# Patient Record
Sex: Female | Born: 1935 | Race: Black or African American | Hispanic: No | Marital: Married | Smoking: Never smoker
Health system: Southern US, Community
[De-identification: ages and names within clinical notes are randomized; demographics above are authoritative.]

## PROBLEM LIST (undated history)

## (undated) DIAGNOSIS — N189 Chronic kidney disease, unspecified: Secondary | ICD-10-CM

## (undated) DIAGNOSIS — K219 Gastro-esophageal reflux disease without esophagitis: Secondary | ICD-10-CM

## (undated) DIAGNOSIS — E785 Hyperlipidemia, unspecified: Secondary | ICD-10-CM

## (undated) DIAGNOSIS — I10 Essential (primary) hypertension: Secondary | ICD-10-CM

## (undated) DIAGNOSIS — R011 Cardiac murmur, unspecified: Secondary | ICD-10-CM

## (undated) HISTORY — DX: Gastro-esophageal reflux disease without esophagitis: K21.9

## (undated) HISTORY — DX: Essential (primary) hypertension: I10

## (undated) HISTORY — DX: Chronic kidney disease, unspecified: N18.9

## (undated) HISTORY — DX: Hyperlipidemia, unspecified: E78.5

## (undated) HISTORY — PX: ABDOMINAL HYSTERECTOMY: SHX81

## (undated) HISTORY — PX: COLONOSCOPY: SHX174

---

## 2001-09-29 ENCOUNTER — Ambulatory Visit (HOSPITAL_COMMUNITY): Admission: RE | Admit: 2001-09-29 | Discharge: 2001-09-29 | Payer: Self-pay | Admitting: Specialist

## 2001-09-29 ENCOUNTER — Encounter: Payer: Self-pay | Admitting: Family Medicine

## 2002-05-11 ENCOUNTER — Ambulatory Visit (HOSPITAL_COMMUNITY): Admission: RE | Admit: 2002-05-11 | Discharge: 2002-05-11 | Payer: Self-pay | Admitting: General Surgery

## 2002-06-08 ENCOUNTER — Encounter: Payer: Self-pay | Admitting: Family Medicine

## 2002-06-08 ENCOUNTER — Ambulatory Visit (HOSPITAL_COMMUNITY): Admission: RE | Admit: 2002-06-08 | Discharge: 2002-06-08 | Payer: Self-pay | Admitting: Family Medicine

## 2002-08-10 ENCOUNTER — Ambulatory Visit (HOSPITAL_COMMUNITY): Admission: RE | Admit: 2002-08-10 | Discharge: 2002-08-10 | Payer: Self-pay | Admitting: Emergency Medicine

## 2002-08-10 ENCOUNTER — Encounter: Payer: Self-pay | Admitting: Family Medicine

## 2002-09-20 ENCOUNTER — Observation Stay (HOSPITAL_COMMUNITY): Admission: RE | Admit: 2002-09-20 | Discharge: 2002-09-21 | Payer: Self-pay | Admitting: General Surgery

## 2002-12-09 ENCOUNTER — Ambulatory Visit (HOSPITAL_COMMUNITY): Admission: RE | Admit: 2002-12-09 | Discharge: 2002-12-09 | Payer: Self-pay | Admitting: Family Medicine

## 2002-12-09 ENCOUNTER — Encounter: Payer: Self-pay | Admitting: Family Medicine

## 2005-09-16 ENCOUNTER — Encounter (INDEPENDENT_AMBULATORY_CARE_PROVIDER_SITE_OTHER): Payer: Self-pay | Admitting: Internal Medicine

## 2005-12-05 LAB — CONVERTED CEMR LAB
HDL: 60 mg/dL
Total CHOL/HDL Ratio: 2.6
VLDL: 18 mg/dL

## 2005-12-12 ENCOUNTER — Ambulatory Visit (HOSPITAL_COMMUNITY): Admission: RE | Admit: 2005-12-12 | Discharge: 2005-12-12 | Payer: Self-pay | Admitting: Family Medicine

## 2006-06-21 ENCOUNTER — Encounter (INDEPENDENT_AMBULATORY_CARE_PROVIDER_SITE_OTHER): Payer: Self-pay | Admitting: Internal Medicine

## 2006-07-04 ENCOUNTER — Encounter (INDEPENDENT_AMBULATORY_CARE_PROVIDER_SITE_OTHER): Payer: Self-pay | Admitting: Internal Medicine

## 2006-07-08 ENCOUNTER — Ambulatory Visit: Payer: Self-pay | Admitting: Internal Medicine

## 2006-07-08 LAB — CONVERTED CEMR LAB
AST: 28 units/L
Albumin: 4.3 g/dL
Alkaline Phosphatase: 108 units/L
BUN: 16 mg/dL
Potassium: 4.1 meq/L
Sodium: 144 meq/L
Total Bilirubin: 0.7 mg/dL

## 2006-07-10 ENCOUNTER — Encounter (INDEPENDENT_AMBULATORY_CARE_PROVIDER_SITE_OTHER): Payer: Self-pay | Admitting: Internal Medicine

## 2006-07-14 ENCOUNTER — Encounter (INDEPENDENT_AMBULATORY_CARE_PROVIDER_SITE_OTHER): Payer: Self-pay | Admitting: Internal Medicine

## 2006-07-17 ENCOUNTER — Ambulatory Visit (HOSPITAL_COMMUNITY): Admission: RE | Admit: 2006-07-17 | Discharge: 2006-07-17 | Payer: Self-pay | Admitting: Internal Medicine

## 2006-07-17 ENCOUNTER — Encounter (INDEPENDENT_AMBULATORY_CARE_PROVIDER_SITE_OTHER): Payer: Self-pay | Admitting: Internal Medicine

## 2006-08-19 ENCOUNTER — Encounter: Payer: Self-pay | Admitting: Internal Medicine

## 2006-08-19 DIAGNOSIS — E785 Hyperlipidemia, unspecified: Secondary | ICD-10-CM

## 2006-08-19 DIAGNOSIS — K589 Irritable bowel syndrome without diarrhea: Secondary | ICD-10-CM

## 2006-08-19 DIAGNOSIS — I10 Essential (primary) hypertension: Secondary | ICD-10-CM | POA: Insufficient documentation

## 2006-08-19 DIAGNOSIS — Z860100 Personal history of colon polyps, unspecified: Secondary | ICD-10-CM | POA: Insufficient documentation

## 2006-08-19 DIAGNOSIS — K219 Gastro-esophageal reflux disease without esophagitis: Secondary | ICD-10-CM | POA: Insufficient documentation

## 2006-12-22 ENCOUNTER — Ambulatory Visit: Payer: Self-pay | Admitting: Internal Medicine

## 2006-12-22 DIAGNOSIS — M810 Age-related osteoporosis without current pathological fracture: Secondary | ICD-10-CM | POA: Insufficient documentation

## 2006-12-24 LAB — CONVERTED CEMR LAB
ALT: 17 units/L (ref 0–35)
AST: 19 units/L (ref 0–37)
Alkaline Phosphatase: 106 units/L (ref 39–117)
CO2: 26 meq/L (ref 19–32)
Cholesterol: 150 mg/dL (ref 0–200)
Creatinine, Ser: 0.96 mg/dL (ref 0.40–1.20)
LDL Cholesterol: 78 mg/dL (ref 0–99)
Sodium: 142 meq/L (ref 135–145)
Total Bilirubin: 0.5 mg/dL (ref 0.3–1.2)
Total CHOL/HDL Ratio: 2.6
Total Protein: 7.2 g/dL (ref 6.0–8.3)
VLDL: 15 mg/dL (ref 0–40)

## 2006-12-29 ENCOUNTER — Ambulatory Visit (HOSPITAL_COMMUNITY): Admission: RE | Admit: 2006-12-29 | Discharge: 2006-12-29 | Payer: Self-pay

## 2007-01-04 ENCOUNTER — Encounter (INDEPENDENT_AMBULATORY_CARE_PROVIDER_SITE_OTHER): Payer: Self-pay | Admitting: Internal Medicine

## 2007-06-22 ENCOUNTER — Ambulatory Visit: Payer: Self-pay | Admitting: Internal Medicine

## 2007-06-23 ENCOUNTER — Telehealth (INDEPENDENT_AMBULATORY_CARE_PROVIDER_SITE_OTHER): Payer: Self-pay | Admitting: *Deleted

## 2007-06-23 LAB — CONVERTED CEMR LAB
ALT: 17 units/L (ref 0–35)
AST: 25 units/L (ref 0–37)
Albumin: 4.1 g/dL (ref 3.5–5.2)
Alkaline Phosphatase: 88 units/L (ref 39–117)
Calcium: 8.9 mg/dL (ref 8.4–10.5)
Chloride: 106 meq/L (ref 96–112)
Potassium: 3.4 meq/L — ABNORMAL LOW (ref 3.5–5.3)
Sodium: 142 meq/L (ref 135–145)
Total Protein: 6.6 g/dL (ref 6.0–8.3)

## 2008-03-30 ENCOUNTER — Ambulatory Visit: Payer: Self-pay | Admitting: Internal Medicine

## 2008-03-31 LAB — CONVERTED CEMR LAB
ALT: 16 units/L (ref 0–35)
AST: 26 units/L (ref 0–37)
Basophils Absolute: 0 10*3/uL (ref 0.0–0.1)
Basophils Relative: 1 % (ref 0–1)
CO2: 22 meq/L (ref 19–32)
Chloride: 106 meq/L (ref 96–112)
Cholesterol: 144 mg/dL (ref 0–200)
Creatinine, Ser: 0.91 mg/dL (ref 0.40–1.20)
Eosinophils Relative: 1 % (ref 0–5)
Lymphocytes Relative: 43 % (ref 12–46)
MCHC: 31.8 g/dL (ref 30.0–36.0)
Monocytes Absolute: 0.7 10*3/uL (ref 0.1–1.0)
Neutro Abs: 2.3 10*3/uL (ref 1.7–7.7)
Platelets: 195 10*3/uL (ref 150–400)
RDW: 13.4 % (ref 11.5–15.5)
Sodium: 142 meq/L (ref 135–145)
Total Bilirubin: 0.6 mg/dL (ref 0.3–1.2)
Total CHOL/HDL Ratio: 2.4
Total Protein: 6.9 g/dL (ref 6.0–8.3)
VLDL: 22 mg/dL (ref 0–40)

## 2008-04-14 ENCOUNTER — Ambulatory Visit (HOSPITAL_COMMUNITY): Admission: RE | Admit: 2008-04-14 | Discharge: 2008-04-14 | Payer: Self-pay | Admitting: Internal Medicine

## 2008-06-28 ENCOUNTER — Ambulatory Visit: Payer: Self-pay | Admitting: Internal Medicine

## 2008-09-04 ENCOUNTER — Telehealth (INDEPENDENT_AMBULATORY_CARE_PROVIDER_SITE_OTHER): Payer: Self-pay | Admitting: *Deleted

## 2008-09-26 ENCOUNTER — Ambulatory Visit: Payer: Self-pay | Admitting: Internal Medicine

## 2008-09-26 DIAGNOSIS — R011 Cardiac murmur, unspecified: Secondary | ICD-10-CM | POA: Insufficient documentation

## 2008-09-27 LAB — CONVERTED CEMR LAB
ALT: 15 units/L (ref 0–35)
AST: 20 units/L (ref 0–37)
Albumin: 4.2 g/dL (ref 3.5–5.2)
Alkaline Phosphatase: 88 units/L (ref 39–117)
Calcium: 9 mg/dL (ref 8.4–10.5)
Chloride: 106 meq/L (ref 96–112)
LDL Cholesterol: 63 mg/dL (ref 0–99)
Potassium: 3.5 meq/L (ref 3.5–5.3)
Sodium: 142 meq/L (ref 135–145)
Total Protein: 6.8 g/dL (ref 6.0–8.3)

## 2008-10-03 ENCOUNTER — Telehealth (INDEPENDENT_AMBULATORY_CARE_PROVIDER_SITE_OTHER): Payer: Self-pay | Admitting: *Deleted

## 2008-10-04 ENCOUNTER — Telehealth (INDEPENDENT_AMBULATORY_CARE_PROVIDER_SITE_OTHER): Payer: Self-pay | Admitting: *Deleted

## 2009-03-27 ENCOUNTER — Ambulatory Visit: Payer: Self-pay | Admitting: Internal Medicine

## 2009-03-28 ENCOUNTER — Encounter (INDEPENDENT_AMBULATORY_CARE_PROVIDER_SITE_OTHER): Payer: Self-pay | Admitting: Internal Medicine

## 2009-03-28 LAB — CONVERTED CEMR LAB
ALT: 14 units/L (ref 0–35)
Albumin: 4.1 g/dL (ref 3.5–5.2)
CO2: 25 meq/L (ref 19–32)
Calcium: 8.8 mg/dL (ref 8.4–10.5)
Chloride: 106 meq/L (ref 96–112)
Cholesterol: 136 mg/dL (ref 0–200)
Glucose, Bld: 78 mg/dL (ref 70–99)
Potassium: 3.8 meq/L (ref 3.5–5.3)
Sodium: 142 meq/L (ref 135–145)
Total Bilirubin: 0.6 mg/dL (ref 0.3–1.2)
Total Protein: 6.9 g/dL (ref 6.0–8.3)
VLDL: 12 mg/dL (ref 0–40)

## 2009-10-25 ENCOUNTER — Ambulatory Visit (HOSPITAL_COMMUNITY): Admission: RE | Admit: 2009-10-25 | Discharge: 2009-10-25 | Payer: Self-pay | Admitting: Internal Medicine

## 2010-05-22 ENCOUNTER — Encounter (HOSPITAL_COMMUNITY): Admission: RE | Admit: 2010-05-22 | Discharge: 2010-06-14 | Payer: Self-pay | Admitting: Internal Medicine

## 2010-05-23 ENCOUNTER — Ambulatory Visit (HOSPITAL_COMMUNITY): Payer: Self-pay | Admitting: Internal Medicine

## 2011-01-31 NOTE — Procedures (Signed)
NAME:  Megan Mcguire, Megan Mcguire NO.:  [id number]   MEDICAL RECORD NO.:  [id number]          PATIENT TYPE:  OUT   LOCATION:  [facility]                           FACILITY:  [facility]   PHYSICIAN:  Dani Gobble, MD       DATE OF BIRTH:  1930-10-13   DATE OF PROCEDURE:  DATE OF DISCHARGE:                                  ECHOCARDIOGRAM   INDICATIONS:  A 77 year old female with past medical history of hypertension  and osteoporosis, referred for evaluation of a cardiac murmur.   The technical quality of this study is adequate.   Aorta measures normally at 2.6 cm.   The left atrium measures normally at 0.4 cm.  No obvious clots or masses  were appreciated, and the patient appeared to be in sinus rhythm during the  procedure.   Interventricular septal and posterior wall are minimally thickened.   The aortic valve is trileaflet and also minimally thickened but without  limitation to leaflet excursion.  No aortic insufficiency is noted.   Doppler interrogation of the aortic valve was within normal limits.   The mitral valve appears mildly thickened, particularly on the anterior  leaflet without limitation to leaflet excursion.  No significant mitral  regurgitation is noted.  No mitral valve prolapse is noted.   Doppler interrogation of the mitral valve is within normal limits.  There  does appear to be a mildly lax chordae.   The pulmonic valve appears grossly structurally normal.   Tricuspid valve appears grossly structurally normal with mild tricuspid  regurgitation noted.   The left ventricle is normal in size with LV IDD measured at 4.0 cm and LV  IC measured at 3.0 cm.  Overall left systolic function is normal.  No  regional wall motion abnormalities noted.  Presence of diastolic dysfunction  is inferred from pulse wave Doppler across the mitral valve.   The right atrium is normal in size, but the right ventricle is mildly  dilated with normal right ventricular systolic  function.   IMPRESSION:  1. Borderline concentric left ventricular hypertrophy.  2. Minimal thickening to the aortic and mitral valve without limitation to      leaflet excursion of either.  3. Mild tricuspid regurgitation.  4. Normal left ventricular size and systolic function without regional      wall motion abnormality noted.  5. Mild right ventricular enlargement with preserved right systolic      function.  6. Presence of diastolic dysfunction is inferred from pulse wave Doppler      across the mitral valve.           ______________________________  Dani Gobble, MD     AB/MEDQ  D:  07/17/2006  T:  07/17/2006  Job:  [id number]   cc:   Dani Gobble, MD  Fax: [phone]   Erle Crocker, M.D.

## 2011-01-31 NOTE — H&P (Signed)
   NAME:  Megan Mcguire, Megan Mcguire                          ACCOUNT NO.:  [id number]   MEDICAL RECORD NO.:  [id number]                   PATIENT TYPE:  AMB   LOCATION:  DAY                                  FACILITY:  [facility]   PHYSICIAN:  Jerolyn Shin C. Katrinka Blazing, M.D.                DATE OF BIRTH:  10-21-1930   DATE OF ADMISSION:  DATE OF DISCHARGE:                                HISTORY & PHYSICAL   HISTORY OF PRESENT ILLNESS:  [age]-year-old female with history of  abdominal pain, past history of distention with a severe episode of pain  associated with nausea and vomiting.  Evaluation revealed cholelithiasis.  The patient is scheduled for cholecystectomy.   PAST HISTORY:  1. Hypertension.  2. Gastroesophageal reflux disease.  3. Hyperlipidemia.   MEDICATIONS:  1. Uniretic 7.5/12.5 q.d.  2. Toprol XL 50 mg q.d.  3. Zocor 20 mg q.h.s.  4. Aspirin 81 mg q.d.  5. Nexium 40 mg q.d.   ALLERGIES:  No known drug allergies.   PAST SURGICAL HISTORY:  Hysterectomy.   PHYSICAL EXAMINATION:  VITAL SIGNS:  Blood pressure 132/84, pulse 68 and  respirations 18.  Weight 131 pounds.  Height 5 feet 2 inches.  HEENT:  Unremarkable.  NECK:  Supple.  No JVD or bruits.  CHEST:  Clear to auscultation.  HEART:  Regular rate and rhythm without murmur, gallop or rub.  ABDOMEN:  Unremarkable, except for fullness in her left upper quadrant.  EXTREMITIES:  No cyanosis, clubbing or edema.  NEUROLOGIC EXAMINATION:  No focal motor, sensory or cerebellar deficit.   IMPRESSION:  1. Cholecystitis with cholelithiasis.  2. Hypertension.  3.     Hyperlipidemia.  4. Gastroesophageal reflux disease.   PLAN:  Laparoscopic cholecystectomy.                                                 Dirk Dress. Katrinka Blazing, M.D.    LCS/MEDQ  D:  09/19/2002  T:  09/20/2002  Job:  [id number]

## 2011-01-31 NOTE — Op Note (Signed)
   NAME:  Megan Mcguire, Megan Mcguire                          ACCOUNT NO.:  [id number]   MEDICAL RECORD NO.:  [id number]                   PATIENT TYPE:  AMB   LOCATION:  DAY                                  FACILITY:  [facility]   PHYSICIAN:  Jerolyn Shin C. Katrinka Blazing, M.D.                DATE OF BIRTH:  December 02, 1930   DATE OF PROCEDURE:  DATE OF DISCHARGE:                                 OPERATIVE REPORT   PREOPERATIVE DIAGNOSIS:  Cholelithiasis and cholecystitis.   POSTOPERATIVE DIAGNOSIS:  Cholelithiasis and cholecystitis.   OPERATION PERFORMED:  Laparoscopic cholecystectomy.   SURGEON:  Dirk Dress. Katrinka Blazing, M.D.   ANESTHESIA:  General.   DESCRIPTION OF PROCEDURE:  Under general endotracheal anesthesia, the  patient's abdomen was prepped and draped in sterile field.  A supraumbilical  incision was made.  A Veress needle was inserted without difficulty.  The  abdomen was insufflated with 2L of CO2.  The laparoscope was placed using a  Visiport guide.  A 10 mm port was placed uneventfully.  A laparoscope was  placed.  The gallbladder was visualized.  Using videoscopic guidance, a 10  mm and two 5 mm ports were placed uneventfully.  The gallbladder was grasped  and positioned.  The cystic artery was dissected, clipped with four clips  and divided.  The cystic duct was dissected, clipped with 5 clips and  divided.  There was a posterior cystic artery branch.  It was clipped with  three clips and divided.  Using electrocautery the gallbladder was separated  from the infrahepatic bed without difficulty.  Hemostasis was excellent.  The gallbladder was retrieved intact.  There was minimal blood loss.  Copious irrigation was carried out.  CO2 was allowed go escape from the  abdomen and the ports were removed.  The incisions were closed using 0 Dexon  on the fascia and staples on the skin.  Sterile dressings were placed.  She  was awakened from anesthesia uneventfully, transferred to her bed and taken  to the post  anesthesia care unit.                                                Dirk Dress. Katrinka Blazing, M.D.    LCS/MEDQ  D:  09/20/2002  T:  09/20/2002  Job:  [id number]

## 2011-01-31 NOTE — H&P (Signed)
   NAME:  Megan Mcguire, Megan Mcguire                          ACCOUNT NO.:  [id number]   MEDICAL RECORD NO.:  [id number]                   PATIENT TYPE:  AMB   LOCATION:  DAY                                  FACILITY:  [facility]   PHYSICIAN:  Jerolyn Shin C. Katrinka Blazing, M.D.                DATE OF BIRTH:  1931/03/02   DATE OF ADMISSION:  DATE OF DISCHARGE:                                HISTORY & PHYSICAL   HISTORY OF PRESENT ILLNESS:  [age]-year-old female with heme-/guaiac-  positive stool on fecal occult blood testing.  She has no history of blood  in her stool.  She has some gas pains.  There has been no constipation or  diarrhea.  Her weight has been stable.  There is no family history of colon  cancer.  The patient also has a history of indigestion, belching, food  intolerance, chest pain in the right upper quadrant, especially with greasy  and spicy foods.  The patient is scheduled for upper endoscopy and  colonoscopy.   PAST HISTORY:  She has hyperlipidemia, hypertension, gastroesophageal reflux  disease.   MEDICATIONS:  1. Maxzide 25 mg q.d.  2. Nexium 40 mg q.d.  3. Toprol-XL 50 mg q.d.  4. Zocor 20 mg q.h.s.   PHYSICAL EXAMINATION:  VITAL SIGNS:  Blood pressure 130/76, pulse 60,  respirations 18.  Weight 128 pounds.  HEENT:  Unremarkable.  NECK:  Neck supple without JVD or bruit.  CHEST:  Clear to auscultation.  HEART:  Regular rate and rhythm without murmur, gallop or rub.  ABDOMEN:  Fullness in the left upper quadrant and left mid-abdomen with  tenderness in the left mid-abdomen.  RECTAL:  Normal.  No masses felt.  Stool guaiac-positive.  EXTREMITIES:  No cyanosis, clubbing or edema.  NEUROLOGIC:  No focal motor, sensory or cerebellar deficit.   IMPRESSION:  1. Guaiac-positive stools.  2. Peptic ulcer disease with probable gastroesophageal reflux disease.  3. Abdominal mass.  4. Hypertension.   PLAN:  EGD and colonoscopy and CT of the abdomen and pelvis.                           Dirk Dress. Katrinka Blazing, M.D.   LCS/MEDQ  D:  05/10/2002  T:  05/11/2002  Job:  [id number]

## 2011-01-31 NOTE — Op Note (Signed)
   NAME:  Megan Mcguire, Megan Mcguire                          ACCOUNT NO.:  [id number]   MEDICAL RECORD NO.:  [id number]                   PATIENT TYPE:  AMB   LOCATION:  DAY                                  FACILITY:  [facility]   PHYSICIAN:  Jerolyn Shin C. Katrinka Blazing, M.D.                DATE OF BIRTH:  1930-11-02   DATE OF PROCEDURE:  09/20/2002  DATE OF DISCHARGE:                                 OPERATIVE REPORT   PREOPERATIVE DIAGNOSES:  Cholelithiasis, cholecystitis.   POSTOPERATIVE DIAGNOSES:  Cholelithiasis, cholecystitis.   PROCEDURE:  Laparoscopic cholecystectomy.   SURGEON:  Dirk Dress. Katrinka Blazing, M.D.   DESCRIPTION OF PROCEDURE:  Under general anesthesia, the patient's abdomen  was prepped and draped in a sterile field. A supraumbilical incision was  made. The Veress needle was inserted uneventfully. The abdomen was  insufflated with two liters of CO2. Using a Visiport guide, a 10 mm port was  placed uneventfully. The laparoscope was placed. Under videoscopic guidance,  a 10 mm port and two 5 mm ports were placed in the right upper quadrant. The  gallbladder was grasped and positioned. The cystic artery was dissected,  clipped with four clips and divided. The cystic duct was dissected, clipped  with five clips and divided. There was a posterior cystic artery branch  which was clipped with three clips and divided. Next, using electrocautery,  the gallbladder was oblated from the intrahepatic bed without difficulty.  Hemostasis in the bed was excellent. There was minimal blood loss. The  gallbladder was retrieved intact. Copious irrigation was carried out  uneventfully. The patient tolerated the procedure well. It should be noted  that I evaluated the left upper quadrant, the subphrenic area, the left  subhepatic area and the stomach. No mass effect was seen in this area. There  did not appear to be any mass of any significance in the retrogastric area.  CO2 was allowed to escape from the abdomen and the  ports were removed. The  incisions were closed.                                               Dirk Dress. Katrinka Blazing, M.D.    LCS/MEDQ  D:  09/20/2002  T:  09/20/2002  Job:  [id number]

## 2011-12-30 DIAGNOSIS — E785 Hyperlipidemia, unspecified: Secondary | ICD-10-CM | POA: Diagnosis not present

## 2011-12-30 DIAGNOSIS — I10 Essential (primary) hypertension: Secondary | ICD-10-CM | POA: Diagnosis not present

## 2012-07-02 DIAGNOSIS — R7301 Impaired fasting glucose: Secondary | ICD-10-CM | POA: Diagnosis not present

## 2012-07-02 DIAGNOSIS — K589 Irritable bowel syndrome without diarrhea: Secondary | ICD-10-CM | POA: Diagnosis not present

## 2012-07-02 DIAGNOSIS — E785 Hyperlipidemia, unspecified: Secondary | ICD-10-CM | POA: Diagnosis not present

## 2012-07-02 DIAGNOSIS — E782 Mixed hyperlipidemia: Secondary | ICD-10-CM | POA: Diagnosis not present

## 2012-07-02 DIAGNOSIS — I10 Essential (primary) hypertension: Secondary | ICD-10-CM | POA: Diagnosis not present

## 2012-07-02 DIAGNOSIS — Z23 Encounter for immunization: Secondary | ICD-10-CM | POA: Diagnosis not present

## 2012-12-29 DIAGNOSIS — E782 Mixed hyperlipidemia: Secondary | ICD-10-CM | POA: Diagnosis not present

## 2012-12-29 DIAGNOSIS — M810 Age-related osteoporosis without current pathological fracture: Secondary | ICD-10-CM | POA: Diagnosis not present

## 2012-12-29 DIAGNOSIS — R7301 Impaired fasting glucose: Secondary | ICD-10-CM | POA: Diagnosis not present

## 2012-12-29 DIAGNOSIS — I10 Essential (primary) hypertension: Secondary | ICD-10-CM | POA: Diagnosis not present

## 2012-12-29 DIAGNOSIS — R7309 Other abnormal glucose: Secondary | ICD-10-CM | POA: Diagnosis not present

## 2012-12-30 ENCOUNTER — Other Ambulatory Visit (HOSPITAL_COMMUNITY): Payer: Self-pay | Admitting: Internal Medicine

## 2012-12-30 DIAGNOSIS — Z9289 Personal history of other medical treatment: Secondary | ICD-10-CM

## 2013-01-04 ENCOUNTER — Other Ambulatory Visit (HOSPITAL_COMMUNITY): Payer: Self-pay

## 2013-01-07 ENCOUNTER — Ambulatory Visit (HOSPITAL_COMMUNITY)
Admission: RE | Admit: 2013-01-07 | Discharge: 2013-01-07 | Disposition: A | Discharge status: Home / Self Care | Payer: Medicare Other | Source: Ambulatory Visit | Attending: Internal Medicine | Admitting: Internal Medicine

## 2013-01-07 DIAGNOSIS — M810 Age-related osteoporosis without current pathological fracture: Secondary | ICD-10-CM | POA: Diagnosis not present

## 2013-01-07 DIAGNOSIS — Z9289 Personal history of other medical treatment: Secondary | ICD-10-CM

## 2013-01-31 DIAGNOSIS — L820 Inflamed seborrheic keratosis: Secondary | ICD-10-CM | POA: Diagnosis not present

## 2013-06-30 DIAGNOSIS — K589 Irritable bowel syndrome without diarrhea: Secondary | ICD-10-CM | POA: Diagnosis not present

## 2013-06-30 DIAGNOSIS — K219 Gastro-esophageal reflux disease without esophagitis: Secondary | ICD-10-CM | POA: Diagnosis not present

## 2013-06-30 DIAGNOSIS — E782 Mixed hyperlipidemia: Secondary | ICD-10-CM | POA: Diagnosis not present

## 2013-06-30 DIAGNOSIS — E785 Hyperlipidemia, unspecified: Secondary | ICD-10-CM | POA: Diagnosis not present

## 2013-06-30 DIAGNOSIS — I10 Essential (primary) hypertension: Secondary | ICD-10-CM | POA: Diagnosis not present

## 2013-06-30 DIAGNOSIS — K5900 Constipation, unspecified: Secondary | ICD-10-CM | POA: Diagnosis not present

## 2014-01-02 DIAGNOSIS — I10 Essential (primary) hypertension: Secondary | ICD-10-CM | POA: Diagnosis not present

## 2014-01-02 DIAGNOSIS — E782 Mixed hyperlipidemia: Secondary | ICD-10-CM | POA: Diagnosis not present

## 2014-01-02 DIAGNOSIS — R7309 Other abnormal glucose: Secondary | ICD-10-CM | POA: Diagnosis not present

## 2014-01-04 DIAGNOSIS — I10 Essential (primary) hypertension: Secondary | ICD-10-CM | POA: Diagnosis not present

## 2014-01-04 DIAGNOSIS — M810 Age-related osteoporosis without current pathological fracture: Secondary | ICD-10-CM | POA: Diagnosis not present

## 2014-01-04 DIAGNOSIS — E782 Mixed hyperlipidemia: Secondary | ICD-10-CM | POA: Diagnosis not present

## 2014-01-04 DIAGNOSIS — R7309 Other abnormal glucose: Secondary | ICD-10-CM | POA: Diagnosis not present

## 2014-06-02 DIAGNOSIS — I10 Essential (primary) hypertension: Secondary | ICD-10-CM | POA: Diagnosis not present

## 2014-06-02 DIAGNOSIS — E782 Mixed hyperlipidemia: Secondary | ICD-10-CM | POA: Diagnosis not present

## 2014-06-02 DIAGNOSIS — R7309 Other abnormal glucose: Secondary | ICD-10-CM | POA: Diagnosis not present

## 2014-06-06 DIAGNOSIS — Z23 Encounter for immunization: Secondary | ICD-10-CM | POA: Diagnosis not present

## 2014-06-06 DIAGNOSIS — E785 Hyperlipidemia, unspecified: Secondary | ICD-10-CM | POA: Diagnosis not present

## 2014-06-06 DIAGNOSIS — R7309 Other abnormal glucose: Secondary | ICD-10-CM | POA: Diagnosis not present

## 2014-06-06 DIAGNOSIS — I10 Essential (primary) hypertension: Secondary | ICD-10-CM | POA: Diagnosis not present

## 2014-06-06 DIAGNOSIS — R609 Edema, unspecified: Secondary | ICD-10-CM | POA: Diagnosis not present

## 2014-11-14 DIAGNOSIS — E782 Mixed hyperlipidemia: Secondary | ICD-10-CM | POA: Diagnosis not present

## 2014-11-14 DIAGNOSIS — R7301 Impaired fasting glucose: Secondary | ICD-10-CM | POA: Diagnosis not present

## 2014-11-16 DIAGNOSIS — I10 Essential (primary) hypertension: Secondary | ICD-10-CM | POA: Diagnosis not present

## 2014-11-16 DIAGNOSIS — K219 Gastro-esophageal reflux disease without esophagitis: Secondary | ICD-10-CM | POA: Diagnosis not present

## 2014-11-16 DIAGNOSIS — R809 Proteinuria, unspecified: Secondary | ICD-10-CM | POA: Diagnosis not present

## 2014-11-16 DIAGNOSIS — R7301 Impaired fasting glucose: Secondary | ICD-10-CM | POA: Diagnosis not present

## 2014-11-17 ENCOUNTER — Other Ambulatory Visit (HOSPITAL_COMMUNITY): Payer: Self-pay | Admitting: Internal Medicine

## 2014-11-17 DIAGNOSIS — M8580 Other specified disorders of bone density and structure, unspecified site: Secondary | ICD-10-CM

## 2014-11-17 DIAGNOSIS — Z1231 Encounter for screening mammogram for malignant neoplasm of breast: Secondary | ICD-10-CM

## 2014-11-29 ENCOUNTER — Ambulatory Visit (HOSPITAL_COMMUNITY)
Admission: RE | Admit: 2014-11-29 | Discharge: 2014-11-29 | Disposition: A | Discharge status: Home / Self Care | Payer: Medicare Other | Source: Ambulatory Visit | Attending: Internal Medicine | Admitting: Internal Medicine

## 2014-11-29 DIAGNOSIS — Z1231 Encounter for screening mammogram for malignant neoplasm of breast: Secondary | ICD-10-CM | POA: Insufficient documentation

## 2014-11-29 DIAGNOSIS — Z780 Asymptomatic menopausal state: Secondary | ICD-10-CM | POA: Diagnosis not present

## 2014-11-29 DIAGNOSIS — M810 Age-related osteoporosis without current pathological fracture: Secondary | ICD-10-CM | POA: Insufficient documentation

## 2014-11-29 DIAGNOSIS — M8580 Other specified disorders of bone density and structure, unspecified site: Secondary | ICD-10-CM

## 2014-12-15 DIAGNOSIS — I10 Essential (primary) hypertension: Secondary | ICD-10-CM | POA: Diagnosis not present

## 2014-12-15 DIAGNOSIS — Z6826 Body mass index (BMI) 26.0-26.9, adult: Secondary | ICD-10-CM | POA: Diagnosis not present

## 2015-04-03 DIAGNOSIS — E782 Mixed hyperlipidemia: Secondary | ICD-10-CM | POA: Diagnosis not present

## 2015-04-03 DIAGNOSIS — I10 Essential (primary) hypertension: Secondary | ICD-10-CM | POA: Diagnosis not present

## 2015-04-03 DIAGNOSIS — R7301 Impaired fasting glucose: Secondary | ICD-10-CM | POA: Diagnosis not present

## 2015-04-05 DIAGNOSIS — R809 Proteinuria, unspecified: Secondary | ICD-10-CM | POA: Diagnosis not present

## 2015-04-05 DIAGNOSIS — I10 Essential (primary) hypertension: Secondary | ICD-10-CM | POA: Diagnosis not present

## 2015-04-05 DIAGNOSIS — K219 Gastro-esophageal reflux disease without esophagitis: Secondary | ICD-10-CM | POA: Diagnosis not present

## 2015-04-05 DIAGNOSIS — M810 Age-related osteoporosis without current pathological fracture: Secondary | ICD-10-CM | POA: Diagnosis not present

## 2015-04-05 DIAGNOSIS — R7301 Impaired fasting glucose: Secondary | ICD-10-CM | POA: Diagnosis not present

## 2015-04-05 DIAGNOSIS — E785 Hyperlipidemia, unspecified: Secondary | ICD-10-CM | POA: Diagnosis not present

## 2015-08-01 DIAGNOSIS — E782 Mixed hyperlipidemia: Secondary | ICD-10-CM | POA: Diagnosis not present

## 2015-08-01 DIAGNOSIS — R7301 Impaired fasting glucose: Secondary | ICD-10-CM | POA: Diagnosis not present

## 2015-08-03 DIAGNOSIS — R7301 Impaired fasting glucose: Secondary | ICD-10-CM | POA: Diagnosis not present

## 2015-08-03 DIAGNOSIS — Z23 Encounter for immunization: Secondary | ICD-10-CM | POA: Diagnosis not present

## 2015-08-03 DIAGNOSIS — R944 Abnormal results of kidney function studies: Secondary | ICD-10-CM | POA: Diagnosis not present

## 2015-08-03 DIAGNOSIS — E782 Mixed hyperlipidemia: Secondary | ICD-10-CM | POA: Diagnosis not present

## 2015-08-03 DIAGNOSIS — I10 Essential (primary) hypertension: Secondary | ICD-10-CM | POA: Diagnosis not present

## 2015-11-01 DIAGNOSIS — R7301 Impaired fasting glucose: Secondary | ICD-10-CM | POA: Diagnosis not present

## 2015-11-01 DIAGNOSIS — E782 Mixed hyperlipidemia: Secondary | ICD-10-CM | POA: Diagnosis not present

## 2015-11-01 DIAGNOSIS — I10 Essential (primary) hypertension: Secondary | ICD-10-CM | POA: Diagnosis not present

## 2015-11-05 DIAGNOSIS — R7301 Impaired fasting glucose: Secondary | ICD-10-CM | POA: Diagnosis not present

## 2015-11-05 DIAGNOSIS — I10 Essential (primary) hypertension: Secondary | ICD-10-CM | POA: Diagnosis not present

## 2015-11-05 DIAGNOSIS — E782 Mixed hyperlipidemia: Secondary | ICD-10-CM | POA: Diagnosis not present

## 2015-11-05 DIAGNOSIS — N182 Chronic kidney disease, stage 2 (mild): Secondary | ICD-10-CM | POA: Diagnosis not present

## 2015-11-05 DIAGNOSIS — K219 Gastro-esophageal reflux disease without esophagitis: Secondary | ICD-10-CM | POA: Diagnosis not present

## 2015-11-05 DIAGNOSIS — Z23 Encounter for immunization: Secondary | ICD-10-CM | POA: Diagnosis not present

## 2015-12-12 DIAGNOSIS — I10 Essential (primary) hypertension: Secondary | ICD-10-CM | POA: Diagnosis not present

## 2015-12-12 DIAGNOSIS — K219 Gastro-esophageal reflux disease without esophagitis: Secondary | ICD-10-CM | POA: Diagnosis not present

## 2015-12-12 DIAGNOSIS — K580 Irritable bowel syndrome with diarrhea: Secondary | ICD-10-CM | POA: Diagnosis not present

## 2016-04-08 DIAGNOSIS — R7301 Impaired fasting glucose: Secondary | ICD-10-CM | POA: Diagnosis not present

## 2016-04-08 DIAGNOSIS — E782 Mixed hyperlipidemia: Secondary | ICD-10-CM | POA: Diagnosis not present

## 2016-04-10 DIAGNOSIS — R2240 Localized swelling, mass and lump, unspecified lower limb: Secondary | ICD-10-CM | POA: Diagnosis not present

## 2016-04-10 DIAGNOSIS — K219 Gastro-esophageal reflux disease without esophagitis: Secondary | ICD-10-CM | POA: Diagnosis not present

## 2016-04-10 DIAGNOSIS — I10 Essential (primary) hypertension: Secondary | ICD-10-CM | POA: Diagnosis not present

## 2016-04-10 DIAGNOSIS — K589 Irritable bowel syndrome without diarrhea: Secondary | ICD-10-CM | POA: Diagnosis not present

## 2016-04-10 DIAGNOSIS — E782 Mixed hyperlipidemia: Secondary | ICD-10-CM | POA: Diagnosis not present

## 2016-04-10 DIAGNOSIS — R7301 Impaired fasting glucose: Secondary | ICD-10-CM | POA: Diagnosis not present

## 2016-09-24 DIAGNOSIS — Z23 Encounter for immunization: Secondary | ICD-10-CM | POA: Diagnosis not present

## 2016-09-24 DIAGNOSIS — R7301 Impaired fasting glucose: Secondary | ICD-10-CM | POA: Diagnosis not present

## 2016-09-24 DIAGNOSIS — E782 Mixed hyperlipidemia: Secondary | ICD-10-CM | POA: Diagnosis not present

## 2016-09-26 DIAGNOSIS — B370 Candidal stomatitis: Secondary | ICD-10-CM | POA: Diagnosis not present

## 2016-09-26 DIAGNOSIS — E782 Mixed hyperlipidemia: Secondary | ICD-10-CM | POA: Diagnosis not present

## 2016-09-26 DIAGNOSIS — I10 Essential (primary) hypertension: Secondary | ICD-10-CM | POA: Diagnosis not present

## 2016-09-26 DIAGNOSIS — K219 Gastro-esophageal reflux disease without esophagitis: Secondary | ICD-10-CM | POA: Diagnosis not present

## 2016-09-26 DIAGNOSIS — R944 Abnormal results of kidney function studies: Secondary | ICD-10-CM | POA: Diagnosis not present

## 2016-09-26 DIAGNOSIS — K580 Irritable bowel syndrome with diarrhea: Secondary | ICD-10-CM | POA: Diagnosis not present

## 2016-09-26 DIAGNOSIS — R7301 Impaired fasting glucose: Secondary | ICD-10-CM | POA: Diagnosis not present

## 2016-09-26 DIAGNOSIS — Z0001 Encounter for general adult medical examination with abnormal findings: Secondary | ICD-10-CM | POA: Diagnosis not present

## 2017-03-31 DIAGNOSIS — R7301 Impaired fasting glucose: Secondary | ICD-10-CM | POA: Diagnosis not present

## 2017-03-31 DIAGNOSIS — I10 Essential (primary) hypertension: Secondary | ICD-10-CM | POA: Diagnosis not present

## 2017-04-02 DIAGNOSIS — K219 Gastro-esophageal reflux disease without esophagitis: Secondary | ICD-10-CM | POA: Diagnosis not present

## 2017-04-02 DIAGNOSIS — R7301 Impaired fasting glucose: Secondary | ICD-10-CM | POA: Diagnosis not present

## 2017-04-02 DIAGNOSIS — E782 Mixed hyperlipidemia: Secondary | ICD-10-CM | POA: Diagnosis not present

## 2017-04-02 DIAGNOSIS — I10 Essential (primary) hypertension: Secondary | ICD-10-CM | POA: Diagnosis not present

## 2017-04-02 DIAGNOSIS — N182 Chronic kidney disease, stage 2 (mild): Secondary | ICD-10-CM | POA: Diagnosis not present

## 2017-04-02 DIAGNOSIS — Z6825 Body mass index (BMI) 25.0-25.9, adult: Secondary | ICD-10-CM | POA: Diagnosis not present

## 2017-04-02 DIAGNOSIS — K580 Irritable bowel syndrome with diarrhea: Secondary | ICD-10-CM | POA: Diagnosis not present

## 2017-04-10 ENCOUNTER — Other Ambulatory Visit: Payer: Self-pay

## 2017-07-14 DIAGNOSIS — Z23 Encounter for immunization: Secondary | ICD-10-CM | POA: Diagnosis not present

## 2017-10-01 DIAGNOSIS — I10 Essential (primary) hypertension: Secondary | ICD-10-CM | POA: Diagnosis not present

## 2017-10-01 DIAGNOSIS — N182 Chronic kidney disease, stage 2 (mild): Secondary | ICD-10-CM | POA: Diagnosis not present

## 2017-10-01 DIAGNOSIS — E782 Mixed hyperlipidemia: Secondary | ICD-10-CM | POA: Diagnosis not present

## 2017-10-01 DIAGNOSIS — R7301 Impaired fasting glucose: Secondary | ICD-10-CM | POA: Diagnosis not present

## 2017-10-05 DIAGNOSIS — K219 Gastro-esophageal reflux disease without esophagitis: Secondary | ICD-10-CM | POA: Diagnosis not present

## 2017-10-05 DIAGNOSIS — E870 Hyperosmolality and hypernatremia: Secondary | ICD-10-CM | POA: Diagnosis not present

## 2017-10-05 DIAGNOSIS — R7303 Prediabetes: Secondary | ICD-10-CM | POA: Diagnosis not present

## 2017-10-05 DIAGNOSIS — I10 Essential (primary) hypertension: Secondary | ICD-10-CM | POA: Diagnosis not present

## 2017-10-05 DIAGNOSIS — Z6826 Body mass index (BMI) 26.0-26.9, adult: Secondary | ICD-10-CM | POA: Diagnosis not present

## 2017-10-05 DIAGNOSIS — N182 Chronic kidney disease, stage 2 (mild): Secondary | ICD-10-CM | POA: Diagnosis not present

## 2017-10-05 DIAGNOSIS — E782 Mixed hyperlipidemia: Secondary | ICD-10-CM | POA: Diagnosis not present

## 2017-10-05 DIAGNOSIS — K580 Irritable bowel syndrome with diarrhea: Secondary | ICD-10-CM | POA: Diagnosis not present

## 2018-03-28 DIAGNOSIS — E782 Mixed hyperlipidemia: Secondary | ICD-10-CM | POA: Diagnosis not present

## 2018-03-28 DIAGNOSIS — R7301 Impaired fasting glucose: Secondary | ICD-10-CM | POA: Diagnosis not present

## 2018-03-28 DIAGNOSIS — I10 Essential (primary) hypertension: Secondary | ICD-10-CM | POA: Diagnosis not present

## 2018-04-01 DIAGNOSIS — E870 Hyperosmolality and hypernatremia: Secondary | ICD-10-CM | POA: Diagnosis not present

## 2018-04-01 DIAGNOSIS — I10 Essential (primary) hypertension: Secondary | ICD-10-CM | POA: Diagnosis not present

## 2018-04-01 DIAGNOSIS — K219 Gastro-esophageal reflux disease without esophagitis: Secondary | ICD-10-CM | POA: Diagnosis not present

## 2018-04-01 DIAGNOSIS — R7303 Prediabetes: Secondary | ICD-10-CM | POA: Diagnosis not present

## 2018-04-01 DIAGNOSIS — Z6826 Body mass index (BMI) 26.0-26.9, adult: Secondary | ICD-10-CM | POA: Diagnosis not present

## 2018-04-01 DIAGNOSIS — K580 Irritable bowel syndrome with diarrhea: Secondary | ICD-10-CM | POA: Diagnosis not present

## 2018-04-01 DIAGNOSIS — E782 Mixed hyperlipidemia: Secondary | ICD-10-CM | POA: Diagnosis not present

## 2018-04-01 DIAGNOSIS — N182 Chronic kidney disease, stage 2 (mild): Secondary | ICD-10-CM | POA: Diagnosis not present

## 2018-04-01 DIAGNOSIS — Z0000 Encounter for general adult medical examination without abnormal findings: Secondary | ICD-10-CM | POA: Diagnosis not present

## 2018-08-20 DIAGNOSIS — Z23 Encounter for immunization: Secondary | ICD-10-CM | POA: Diagnosis not present

## 2018-10-05 DIAGNOSIS — R7303 Prediabetes: Secondary | ICD-10-CM | POA: Diagnosis not present

## 2018-10-05 DIAGNOSIS — I10 Essential (primary) hypertension: Secondary | ICD-10-CM | POA: Diagnosis not present

## 2018-10-05 DIAGNOSIS — R7301 Impaired fasting glucose: Secondary | ICD-10-CM | POA: Diagnosis not present

## 2018-10-05 DIAGNOSIS — E782 Mixed hyperlipidemia: Secondary | ICD-10-CM | POA: Diagnosis not present

## 2018-10-14 DIAGNOSIS — E870 Hyperosmolality and hypernatremia: Secondary | ICD-10-CM | POA: Diagnosis not present

## 2018-10-14 DIAGNOSIS — K580 Irritable bowel syndrome with diarrhea: Secondary | ICD-10-CM | POA: Diagnosis not present

## 2018-10-14 DIAGNOSIS — E782 Mixed hyperlipidemia: Secondary | ICD-10-CM | POA: Diagnosis not present

## 2018-10-14 DIAGNOSIS — K219 Gastro-esophageal reflux disease without esophagitis: Secondary | ICD-10-CM | POA: Diagnosis not present

## 2018-10-14 DIAGNOSIS — N182 Chronic kidney disease, stage 2 (mild): Secondary | ICD-10-CM | POA: Diagnosis not present

## 2018-10-14 DIAGNOSIS — R7303 Prediabetes: Secondary | ICD-10-CM | POA: Diagnosis not present

## 2018-10-14 DIAGNOSIS — I10 Essential (primary) hypertension: Secondary | ICD-10-CM | POA: Diagnosis not present

## 2018-10-14 DIAGNOSIS — K588 Other irritable bowel syndrome: Secondary | ICD-10-CM | POA: Diagnosis not present

## 2019-03-28 DIAGNOSIS — K219 Gastro-esophageal reflux disease without esophagitis: Secondary | ICD-10-CM | POA: Diagnosis not present

## 2019-03-28 DIAGNOSIS — B379 Candidiasis, unspecified: Secondary | ICD-10-CM | POA: Diagnosis not present

## 2019-04-14 ENCOUNTER — Other Ambulatory Visit: Payer: Self-pay

## 2019-04-18 DIAGNOSIS — E782 Mixed hyperlipidemia: Secondary | ICD-10-CM | POA: Diagnosis not present

## 2019-04-18 DIAGNOSIS — R7303 Prediabetes: Secondary | ICD-10-CM | POA: Diagnosis not present

## 2019-04-18 DIAGNOSIS — I10 Essential (primary) hypertension: Secondary | ICD-10-CM | POA: Diagnosis not present

## 2019-04-18 DIAGNOSIS — R7301 Impaired fasting glucose: Secondary | ICD-10-CM | POA: Diagnosis not present

## 2019-04-22 DIAGNOSIS — E870 Hyperosmolality and hypernatremia: Secondary | ICD-10-CM | POA: Diagnosis not present

## 2019-04-22 DIAGNOSIS — Z0000 Encounter for general adult medical examination without abnormal findings: Secondary | ICD-10-CM | POA: Diagnosis not present

## 2019-04-22 DIAGNOSIS — I10 Essential (primary) hypertension: Secondary | ICD-10-CM | POA: Diagnosis not present

## 2019-04-22 DIAGNOSIS — K580 Irritable bowel syndrome with diarrhea: Secondary | ICD-10-CM | POA: Diagnosis not present

## 2019-04-22 DIAGNOSIS — N182 Chronic kidney disease, stage 2 (mild): Secondary | ICD-10-CM | POA: Diagnosis not present

## 2019-04-22 DIAGNOSIS — K219 Gastro-esophageal reflux disease without esophagitis: Secondary | ICD-10-CM | POA: Diagnosis not present

## 2019-04-22 DIAGNOSIS — R7303 Prediabetes: Secondary | ICD-10-CM | POA: Diagnosis not present

## 2019-04-22 DIAGNOSIS — E782 Mixed hyperlipidemia: Secondary | ICD-10-CM | POA: Diagnosis not present

## 2019-10-17 DIAGNOSIS — Z23 Encounter for immunization: Secondary | ICD-10-CM | POA: Diagnosis not present

## 2019-11-01 DIAGNOSIS — R7303 Prediabetes: Secondary | ICD-10-CM | POA: Diagnosis not present

## 2019-11-01 DIAGNOSIS — E782 Mixed hyperlipidemia: Secondary | ICD-10-CM | POA: Diagnosis not present

## 2019-11-01 DIAGNOSIS — M816 Localized osteoporosis [Lequesne]: Secondary | ICD-10-CM | POA: Diagnosis not present

## 2019-11-01 DIAGNOSIS — K588 Other irritable bowel syndrome: Secondary | ICD-10-CM | POA: Diagnosis not present

## 2019-11-01 DIAGNOSIS — K580 Irritable bowel syndrome with diarrhea: Secondary | ICD-10-CM | POA: Diagnosis not present

## 2019-11-01 DIAGNOSIS — B379 Candidiasis, unspecified: Secondary | ICD-10-CM | POA: Diagnosis not present

## 2019-11-01 DIAGNOSIS — K219 Gastro-esophageal reflux disease without esophagitis: Secondary | ICD-10-CM | POA: Diagnosis not present

## 2019-11-01 DIAGNOSIS — Z6826 Body mass index (BMI) 26.0-26.9, adult: Secondary | ICD-10-CM | POA: Diagnosis not present

## 2019-11-01 DIAGNOSIS — I10 Essential (primary) hypertension: Secondary | ICD-10-CM | POA: Diagnosis not present

## 2019-11-01 DIAGNOSIS — R7301 Impaired fasting glucose: Secondary | ICD-10-CM | POA: Diagnosis not present

## 2019-11-01 DIAGNOSIS — R011 Cardiac murmur, unspecified: Secondary | ICD-10-CM | POA: Diagnosis not present

## 2019-11-01 DIAGNOSIS — N182 Chronic kidney disease, stage 2 (mild): Secondary | ICD-10-CM | POA: Diagnosis not present

## 2019-11-04 DIAGNOSIS — R7303 Prediabetes: Secondary | ICD-10-CM | POA: Diagnosis not present

## 2019-11-04 DIAGNOSIS — E870 Hyperosmolality and hypernatremia: Secondary | ICD-10-CM | POA: Diagnosis not present

## 2019-11-04 DIAGNOSIS — E782 Mixed hyperlipidemia: Secondary | ICD-10-CM | POA: Diagnosis not present

## 2019-11-04 DIAGNOSIS — I129 Hypertensive chronic kidney disease with stage 1 through stage 4 chronic kidney disease, or unspecified chronic kidney disease: Secondary | ICD-10-CM | POA: Diagnosis not present

## 2019-11-04 DIAGNOSIS — K219 Gastro-esophageal reflux disease without esophagitis: Secondary | ICD-10-CM | POA: Diagnosis not present

## 2019-11-04 DIAGNOSIS — K580 Irritable bowel syndrome with diarrhea: Secondary | ICD-10-CM | POA: Diagnosis not present

## 2019-11-14 DIAGNOSIS — Z23 Encounter for immunization: Secondary | ICD-10-CM | POA: Diagnosis not present

## 2019-11-29 DIAGNOSIS — H1045 Other chronic allergic conjunctivitis: Secondary | ICD-10-CM | POA: Diagnosis not present

## 2019-12-26 DIAGNOSIS — Z20828 Contact with and (suspected) exposure to other viral communicable diseases: Secondary | ICD-10-CM | POA: Diagnosis not present

## 2019-12-26 DIAGNOSIS — U071 COVID-19: Secondary | ICD-10-CM | POA: Diagnosis not present

## 2020-01-03 DIAGNOSIS — U071 COVID-19: Secondary | ICD-10-CM | POA: Diagnosis not present

## 2020-01-03 DIAGNOSIS — Z20828 Contact with and (suspected) exposure to other viral communicable diseases: Secondary | ICD-10-CM | POA: Diagnosis not present

## 2020-04-25 DIAGNOSIS — E870 Hyperosmolality and hypernatremia: Secondary | ICD-10-CM | POA: Diagnosis not present

## 2020-04-25 DIAGNOSIS — K580 Irritable bowel syndrome with diarrhea: Secondary | ICD-10-CM | POA: Diagnosis not present

## 2020-04-25 DIAGNOSIS — H1045 Other chronic allergic conjunctivitis: Secondary | ICD-10-CM | POA: Diagnosis not present

## 2020-04-25 DIAGNOSIS — B379 Candidiasis, unspecified: Secondary | ICD-10-CM | POA: Diagnosis not present

## 2020-04-25 DIAGNOSIS — N182 Chronic kidney disease, stage 2 (mild): Secondary | ICD-10-CM | POA: Diagnosis not present

## 2020-04-25 DIAGNOSIS — E782 Mixed hyperlipidemia: Secondary | ICD-10-CM | POA: Diagnosis not present

## 2020-04-25 DIAGNOSIS — K588 Other irritable bowel syndrome: Secondary | ICD-10-CM | POA: Diagnosis not present

## 2020-04-25 DIAGNOSIS — R7301 Impaired fasting glucose: Secondary | ICD-10-CM | POA: Diagnosis not present

## 2020-04-25 DIAGNOSIS — K219 Gastro-esophageal reflux disease without esophagitis: Secondary | ICD-10-CM | POA: Diagnosis not present

## 2020-04-25 DIAGNOSIS — R011 Cardiac murmur, unspecified: Secondary | ICD-10-CM | POA: Diagnosis not present

## 2020-04-25 DIAGNOSIS — M816 Localized osteoporosis [Lequesne]: Secondary | ICD-10-CM | POA: Diagnosis not present

## 2020-04-25 DIAGNOSIS — I10 Essential (primary) hypertension: Secondary | ICD-10-CM | POA: Diagnosis not present

## 2020-05-01 DIAGNOSIS — E870 Hyperosmolality and hypernatremia: Secondary | ICD-10-CM | POA: Diagnosis not present

## 2020-05-01 DIAGNOSIS — R7301 Impaired fasting glucose: Secondary | ICD-10-CM | POA: Diagnosis not present

## 2020-05-01 DIAGNOSIS — E782 Mixed hyperlipidemia: Secondary | ICD-10-CM | POA: Diagnosis not present

## 2020-05-01 DIAGNOSIS — K219 Gastro-esophageal reflux disease without esophagitis: Secondary | ICD-10-CM | POA: Diagnosis not present

## 2020-05-01 DIAGNOSIS — N1831 Chronic kidney disease, stage 3a: Secondary | ICD-10-CM | POA: Diagnosis not present

## 2020-05-01 DIAGNOSIS — K580 Irritable bowel syndrome with diarrhea: Secondary | ICD-10-CM | POA: Diagnosis not present

## 2020-05-01 DIAGNOSIS — B370 Candidal stomatitis: Secondary | ICD-10-CM | POA: Diagnosis not present

## 2020-05-01 DIAGNOSIS — Z0001 Encounter for general adult medical examination with abnormal findings: Secondary | ICD-10-CM | POA: Diagnosis not present

## 2020-05-01 DIAGNOSIS — U071 COVID-19: Secondary | ICD-10-CM | POA: Diagnosis not present

## 2020-05-01 DIAGNOSIS — Z20828 Contact with and (suspected) exposure to other viral communicable diseases: Secondary | ICD-10-CM | POA: Diagnosis not present

## 2020-05-01 DIAGNOSIS — I129 Hypertensive chronic kidney disease with stage 1 through stage 4 chronic kidney disease, or unspecified chronic kidney disease: Secondary | ICD-10-CM | POA: Diagnosis not present

## 2020-05-28 DIAGNOSIS — Z20828 Contact with and (suspected) exposure to other viral communicable diseases: Secondary | ICD-10-CM | POA: Diagnosis not present

## 2020-05-28 DIAGNOSIS — U071 COVID-19: Secondary | ICD-10-CM | POA: Diagnosis not present

## 2020-06-29 DIAGNOSIS — L235 Allergic contact dermatitis due to other chemical products: Secondary | ICD-10-CM | POA: Diagnosis not present

## 2020-07-13 DIAGNOSIS — I129 Hypertensive chronic kidney disease with stage 1 through stage 4 chronic kidney disease, or unspecified chronic kidney disease: Secondary | ICD-10-CM | POA: Diagnosis not present

## 2020-07-13 DIAGNOSIS — N182 Chronic kidney disease, stage 2 (mild): Secondary | ICD-10-CM | POA: Diagnosis not present

## 2020-07-13 DIAGNOSIS — E7849 Other hyperlipidemia: Secondary | ICD-10-CM | POA: Diagnosis not present

## 2020-07-13 DIAGNOSIS — K219 Gastro-esophageal reflux disease without esophagitis: Secondary | ICD-10-CM | POA: Diagnosis not present

## 2020-08-24 DIAGNOSIS — Z23 Encounter for immunization: Secondary | ICD-10-CM | POA: Diagnosis not present

## 2020-10-13 DIAGNOSIS — E870 Hyperosmolality and hypernatremia: Secondary | ICD-10-CM | POA: Diagnosis not present

## 2020-10-13 DIAGNOSIS — E782 Mixed hyperlipidemia: Secondary | ICD-10-CM | POA: Diagnosis not present

## 2020-10-13 DIAGNOSIS — Z23 Encounter for immunization: Secondary | ICD-10-CM | POA: Diagnosis not present

## 2020-10-13 DIAGNOSIS — I10 Essential (primary) hypertension: Secondary | ICD-10-CM | POA: Diagnosis not present

## 2020-10-25 DIAGNOSIS — N1831 Chronic kidney disease, stage 3a: Secondary | ICD-10-CM | POA: Diagnosis not present

## 2020-10-25 DIAGNOSIS — R011 Cardiac murmur, unspecified: Secondary | ICD-10-CM | POA: Diagnosis not present

## 2020-10-25 DIAGNOSIS — B379 Candidiasis, unspecified: Secondary | ICD-10-CM | POA: Diagnosis not present

## 2020-10-25 DIAGNOSIS — K580 Irritable bowel syndrome with diarrhea: Secondary | ICD-10-CM | POA: Diagnosis not present

## 2020-10-25 DIAGNOSIS — B370 Candidal stomatitis: Secondary | ICD-10-CM | POA: Diagnosis not present

## 2020-10-25 DIAGNOSIS — I10 Essential (primary) hypertension: Secondary | ICD-10-CM | POA: Diagnosis not present

## 2020-10-25 DIAGNOSIS — M816 Localized osteoporosis [Lequesne]: Secondary | ICD-10-CM | POA: Diagnosis not present

## 2020-10-25 DIAGNOSIS — E782 Mixed hyperlipidemia: Secondary | ICD-10-CM | POA: Diagnosis not present

## 2020-10-25 DIAGNOSIS — N182 Chronic kidney disease, stage 2 (mild): Secondary | ICD-10-CM | POA: Diagnosis not present

## 2020-10-25 DIAGNOSIS — H1045 Other chronic allergic conjunctivitis: Secondary | ICD-10-CM | POA: Diagnosis not present

## 2020-10-25 DIAGNOSIS — K219 Gastro-esophageal reflux disease without esophagitis: Secondary | ICD-10-CM | POA: Diagnosis not present

## 2020-10-25 DIAGNOSIS — R7301 Impaired fasting glucose: Secondary | ICD-10-CM | POA: Diagnosis not present

## 2020-10-30 DIAGNOSIS — I129 Hypertensive chronic kidney disease with stage 1 through stage 4 chronic kidney disease, or unspecified chronic kidney disease: Secondary | ICD-10-CM | POA: Diagnosis not present

## 2020-10-30 DIAGNOSIS — K580 Irritable bowel syndrome with diarrhea: Secondary | ICD-10-CM | POA: Diagnosis not present

## 2020-10-30 DIAGNOSIS — B370 Candidal stomatitis: Secondary | ICD-10-CM | POA: Diagnosis not present

## 2020-10-30 DIAGNOSIS — K219 Gastro-esophageal reflux disease without esophagitis: Secondary | ICD-10-CM | POA: Diagnosis not present

## 2020-10-30 DIAGNOSIS — E782 Mixed hyperlipidemia: Secondary | ICD-10-CM | POA: Diagnosis not present

## 2020-10-30 DIAGNOSIS — E870 Hyperosmolality and hypernatremia: Secondary | ICD-10-CM | POA: Diagnosis not present

## 2020-10-30 DIAGNOSIS — E559 Vitamin D deficiency, unspecified: Secondary | ICD-10-CM | POA: Diagnosis not present

## 2020-10-30 DIAGNOSIS — N1831 Chronic kidney disease, stage 3a: Secondary | ICD-10-CM | POA: Diagnosis not present

## 2020-10-30 DIAGNOSIS — R7301 Impaired fasting glucose: Secondary | ICD-10-CM | POA: Diagnosis not present

## 2020-12-12 DIAGNOSIS — E1165 Type 2 diabetes mellitus with hyperglycemia: Secondary | ICD-10-CM | POA: Diagnosis not present

## 2020-12-12 DIAGNOSIS — K922 Gastrointestinal hemorrhage, unspecified: Secondary | ICD-10-CM | POA: Diagnosis not present

## 2020-12-12 DIAGNOSIS — I10 Essential (primary) hypertension: Secondary | ICD-10-CM | POA: Diagnosis not present

## 2020-12-12 DIAGNOSIS — F419 Anxiety disorder, unspecified: Secondary | ICD-10-CM | POA: Diagnosis not present

## 2021-02-12 DIAGNOSIS — K219 Gastro-esophageal reflux disease without esophagitis: Secondary | ICD-10-CM | POA: Diagnosis not present

## 2021-02-12 DIAGNOSIS — E1165 Type 2 diabetes mellitus with hyperglycemia: Secondary | ICD-10-CM | POA: Diagnosis not present

## 2021-03-14 DIAGNOSIS — K219 Gastro-esophageal reflux disease without esophagitis: Secondary | ICD-10-CM | POA: Diagnosis not present

## 2021-03-14 DIAGNOSIS — E1165 Type 2 diabetes mellitus with hyperglycemia: Secondary | ICD-10-CM | POA: Diagnosis not present

## 2021-04-14 DIAGNOSIS — E1165 Type 2 diabetes mellitus with hyperglycemia: Secondary | ICD-10-CM | POA: Diagnosis not present

## 2021-04-14 DIAGNOSIS — K219 Gastro-esophageal reflux disease without esophagitis: Secondary | ICD-10-CM | POA: Diagnosis not present

## 2021-05-02 DIAGNOSIS — E119 Type 2 diabetes mellitus without complications: Secondary | ICD-10-CM | POA: Diagnosis not present

## 2021-05-02 DIAGNOSIS — I10 Essential (primary) hypertension: Secondary | ICD-10-CM | POA: Diagnosis not present

## 2021-05-06 DIAGNOSIS — E870 Hyperosmolality and hypernatremia: Secondary | ICD-10-CM | POA: Diagnosis not present

## 2021-05-06 DIAGNOSIS — Z0001 Encounter for general adult medical examination with abnormal findings: Secondary | ICD-10-CM | POA: Diagnosis not present

## 2021-05-06 DIAGNOSIS — R7301 Impaired fasting glucose: Secondary | ICD-10-CM | POA: Diagnosis not present

## 2021-05-06 DIAGNOSIS — I10 Essential (primary) hypertension: Secondary | ICD-10-CM | POA: Diagnosis not present

## 2021-05-06 DIAGNOSIS — I129 Hypertensive chronic kidney disease with stage 1 through stage 4 chronic kidney disease, or unspecified chronic kidney disease: Secondary | ICD-10-CM | POA: Diagnosis not present

## 2021-05-06 DIAGNOSIS — R809 Proteinuria, unspecified: Secondary | ICD-10-CM | POA: Diagnosis not present

## 2021-05-06 DIAGNOSIS — K219 Gastro-esophageal reflux disease without esophagitis: Secondary | ICD-10-CM | POA: Diagnosis not present

## 2021-05-06 DIAGNOSIS — E782 Mixed hyperlipidemia: Secondary | ICD-10-CM | POA: Diagnosis not present

## 2021-05-06 DIAGNOSIS — N1831 Chronic kidney disease, stage 3a: Secondary | ICD-10-CM | POA: Diagnosis not present

## 2021-05-06 DIAGNOSIS — K580 Irritable bowel syndrome with diarrhea: Secondary | ICD-10-CM | POA: Diagnosis not present

## 2021-05-06 DIAGNOSIS — E559 Vitamin D deficiency, unspecified: Secondary | ICD-10-CM | POA: Diagnosis not present

## 2021-10-15 DIAGNOSIS — I10 Essential (primary) hypertension: Secondary | ICD-10-CM | POA: Diagnosis not present

## 2021-10-15 DIAGNOSIS — E782 Mixed hyperlipidemia: Secondary | ICD-10-CM | POA: Diagnosis not present

## 2021-11-05 DIAGNOSIS — E782 Mixed hyperlipidemia: Secondary | ICD-10-CM | POA: Diagnosis not present

## 2021-11-05 DIAGNOSIS — R7301 Impaired fasting glucose: Secondary | ICD-10-CM | POA: Diagnosis not present

## 2021-11-05 DIAGNOSIS — E559 Vitamin D deficiency, unspecified: Secondary | ICD-10-CM | POA: Diagnosis not present

## 2021-11-07 DIAGNOSIS — R809 Proteinuria, unspecified: Secondary | ICD-10-CM | POA: Diagnosis not present

## 2021-11-07 DIAGNOSIS — E559 Vitamin D deficiency, unspecified: Secondary | ICD-10-CM | POA: Diagnosis not present

## 2021-11-07 DIAGNOSIS — K219 Gastro-esophageal reflux disease without esophagitis: Secondary | ICD-10-CM | POA: Diagnosis not present

## 2021-11-07 DIAGNOSIS — I129 Hypertensive chronic kidney disease with stage 1 through stage 4 chronic kidney disease, or unspecified chronic kidney disease: Secondary | ICD-10-CM | POA: Diagnosis not present

## 2021-11-07 DIAGNOSIS — I10 Essential (primary) hypertension: Secondary | ICD-10-CM | POA: Diagnosis not present

## 2021-11-07 DIAGNOSIS — K589 Irritable bowel syndrome without diarrhea: Secondary | ICD-10-CM | POA: Diagnosis not present

## 2021-11-07 DIAGNOSIS — E870 Hyperosmolality and hypernatremia: Secondary | ICD-10-CM | POA: Diagnosis not present

## 2021-11-07 DIAGNOSIS — R7301 Impaired fasting glucose: Secondary | ICD-10-CM | POA: Diagnosis not present

## 2021-11-07 DIAGNOSIS — N1831 Chronic kidney disease, stage 3a: Secondary | ICD-10-CM | POA: Diagnosis not present

## 2021-11-07 DIAGNOSIS — K580 Irritable bowel syndrome with diarrhea: Secondary | ICD-10-CM | POA: Diagnosis not present

## 2021-11-07 DIAGNOSIS — E782 Mixed hyperlipidemia: Secondary | ICD-10-CM | POA: Diagnosis not present

## 2022-01-14 DIAGNOSIS — H25813 Combined forms of age-related cataract, bilateral: Secondary | ICD-10-CM | POA: Diagnosis not present

## 2022-03-24 DIAGNOSIS — K219 Gastro-esophageal reflux disease without esophagitis: Secondary | ICD-10-CM | POA: Diagnosis not present

## 2022-03-24 DIAGNOSIS — B370 Candidal stomatitis: Secondary | ICD-10-CM | POA: Diagnosis not present

## 2022-04-01 ENCOUNTER — Encounter: Payer: Self-pay | Admitting: Internal Medicine

## 2022-04-14 DIAGNOSIS — I129 Hypertensive chronic kidney disease with stage 1 through stage 4 chronic kidney disease, or unspecified chronic kidney disease: Secondary | ICD-10-CM | POA: Diagnosis not present

## 2022-04-14 DIAGNOSIS — E782 Mixed hyperlipidemia: Secondary | ICD-10-CM | POA: Diagnosis not present

## 2022-04-14 DIAGNOSIS — N1831 Chronic kidney disease, stage 3a: Secondary | ICD-10-CM | POA: Diagnosis not present

## 2022-04-14 DIAGNOSIS — E1122 Type 2 diabetes mellitus with diabetic chronic kidney disease: Secondary | ICD-10-CM | POA: Diagnosis not present

## 2022-04-26 ENCOUNTER — Encounter: Payer: Self-pay | Admitting: Gastroenterology

## 2022-04-26 NOTE — Progress Notes (Unsigned)
   Referring Provider: Benita Stabile, MD Primary Care Physician:  Benita Stabile, MD Primary Gastroenterologist:  Dr. Jena Gauss  No chief complaint on file.   HPI:   Megan Mcguire is a 88 y.o. female presenting today at the request of Margo Aye, Kathleene Hazel, MD for GERD.      No past medical history on file.  *** The histories are not reviewed yet. Please review them in the "History" navigator section and refresh this SmartLink.  No current outpatient medications on file.   No current facility-administered medications for this visit.    Allergies as of 04/28/2022   (Not on File)    No family history on file.  Social History   Socioeconomic History   Marital status: Married    Spouse name: Not on file   Number of children: Not on file   Years of education: Not on file   Highest education level: Not on file  Occupational History   Not on file  Tobacco Use   Smoking status: Not on file   Smokeless tobacco: Not on file  Substance and Sexual Activity   Alcohol use: Not on file   Drug use: Not on file   Sexual activity: Not on file  Other Topics Concern   Not on file  Social History Narrative   Not on file   Social Determinants of Health   Financial Resource Strain: Not on file  Food Insecurity: Not on file  Transportation Needs: Not on file  Physical Activity: Not on file  Stress: Not on file  Social Connections: Not on file  Intimate Partner Violence: Not on file    Review of Systems: Gen: Denies any fever, chills, cold or flulike symptoms, presyncope, syncope. CV: Denies chest pain, heart palpitations. Resp: Denies shortness of breath, cough.  GI: See HPI GU : Denies urinary burning, urinary frequency, urinary hesitancy MS: Denies joint pain. Derm: Denies rash. Psych: Denies depression, anxiety. Heme: See HPI  Physical Exam: There were no vitals taken for this visit. General:   Alert and oriented. Pleasant and cooperative. Well-nourished and well-developed.   Head:  Normocephalic and atraumatic. Eyes:  Without icterus, sclera clear and conjunctiva pink.  Ears:  Normal auditory acuity. Lungs:  Clear to auscultation bilaterally. No wheezes, rales, or rhonchi. No distress.  Heart:  S1, S2 present without murmurs appreciated.  Abdomen:  +BS, soft, non-tender and non-distended. No HSM noted. No guarding or rebound. No masses appreciated.  Rectal:  Deferred  Msk:  Symmetrical without gross deformities. Normal posture. Extremities:  Without edema. Neurologic:  Alert and  oriented x4;  grossly normal neurologically. Skin:  Intact without significant lesions or rashes. Psych:  Normal mood and affect.    Assessment:     Plan:  ***   Ermalinda Memos, PA-C [facility] 04/28/2022

## 2022-04-28 ENCOUNTER — Encounter: Payer: Self-pay | Admitting: *Deleted

## 2022-04-28 ENCOUNTER — Ambulatory Visit (INDEPENDENT_AMBULATORY_CARE_PROVIDER_SITE_OTHER): Payer: Medicare Other | Admitting: Gastroenterology

## 2022-04-28 ENCOUNTER — Encounter: Payer: Self-pay | Admitting: Gastroenterology

## 2022-04-28 VITALS — BP 175/76 | HR 73 | Temp 98.4°F | Ht 61.0 in | Wt 128.6 lb

## 2022-04-28 DIAGNOSIS — R1013 Epigastric pain: Secondary | ICD-10-CM

## 2022-04-28 DIAGNOSIS — K219 Gastro-esophageal reflux disease without esophagitis: Secondary | ICD-10-CM | POA: Diagnosis not present

## 2022-04-28 DIAGNOSIS — K5900 Constipation, unspecified: Secondary | ICD-10-CM

## 2022-04-28 NOTE — Patient Instructions (Addendum)
We will arrange for you to have an upper endoscopy in the near future with Dr. Jena Gauss.  Continue taking pantoprazole 40 mg twice daily 30 minutes for breakfast and dinner for now.  For constipation: Start MiraLAX 1 capful (17 g) daily in 8 ounces of water. If you end up with frequent loose stools with taking MiraLAX daily, you can take every other day or try taking one half capful daily.  We will follow-up with you in the office after your upper endoscopy.  Do not hesitate to call if you have questions or concerns prior to your next visit.  It was very nice to meet you today!   Ermalinda Memos, PA-C [facility]

## 2022-04-29 DIAGNOSIS — E782 Mixed hyperlipidemia: Secondary | ICD-10-CM | POA: Diagnosis not present

## 2022-04-29 DIAGNOSIS — E559 Vitamin D deficiency, unspecified: Secondary | ICD-10-CM | POA: Diagnosis not present

## 2022-04-29 DIAGNOSIS — R7301 Impaired fasting glucose: Secondary | ICD-10-CM | POA: Diagnosis not present

## 2022-05-06 DIAGNOSIS — I10 Essential (primary) hypertension: Secondary | ICD-10-CM | POA: Diagnosis not present

## 2022-05-06 DIAGNOSIS — K580 Irritable bowel syndrome with diarrhea: Secondary | ICD-10-CM | POA: Diagnosis not present

## 2022-05-06 DIAGNOSIS — R7301 Impaired fasting glucose: Secondary | ICD-10-CM | POA: Diagnosis not present

## 2022-05-06 DIAGNOSIS — R809 Proteinuria, unspecified: Secondary | ICD-10-CM | POA: Diagnosis not present

## 2022-05-06 DIAGNOSIS — K5900 Constipation, unspecified: Secondary | ICD-10-CM | POA: Diagnosis not present

## 2022-05-06 DIAGNOSIS — I129 Hypertensive chronic kidney disease with stage 1 through stage 4 chronic kidney disease, or unspecified chronic kidney disease: Secondary | ICD-10-CM | POA: Diagnosis not present

## 2022-05-06 DIAGNOSIS — E870 Hyperosmolality and hypernatremia: Secondary | ICD-10-CM | POA: Diagnosis not present

## 2022-05-06 DIAGNOSIS — N1831 Chronic kidney disease, stage 3a: Secondary | ICD-10-CM | POA: Diagnosis not present

## 2022-05-06 DIAGNOSIS — E782 Mixed hyperlipidemia: Secondary | ICD-10-CM | POA: Diagnosis not present

## 2022-05-06 DIAGNOSIS — E559 Vitamin D deficiency, unspecified: Secondary | ICD-10-CM | POA: Diagnosis not present

## 2022-05-06 DIAGNOSIS — Z0001 Encounter for general adult medical examination with abnormal findings: Secondary | ICD-10-CM | POA: Diagnosis not present

## 2022-05-06 DIAGNOSIS — K219 Gastro-esophageal reflux disease without esophagitis: Secondary | ICD-10-CM | POA: Diagnosis not present

## 2022-05-22 NOTE — Progress Notes (Unsigned)
Cardiology Office Note:    Date:  05/22/2022   ID:  Megan Mcguire, DOB June 04, 1931, MRN [id number]  PCP:  Benita Stabile, MD   [facility] Providers Cardiologist:  None { Click to update primary MD,subspecialty MD or APP then REFRESH:1}    Referring MD: Benita Stabile, MD   No chief complaint on file. ***  History of Present Illness:    Megan Mcguire is a 88 y.o. female seen at the request of Dr Margo Aye for evaluation of cardiac murmur. She has a history of HLD, HTN, and CKD.   Past Medical History:  Diagnosis Date   CKD (chronic kidney disease)    GERD (gastroesophageal reflux disease)    HLD (hyperlipidemia)    HTN (hypertension)     Past Surgical History:  Procedure Laterality Date   COLONOSCOPY     years ago with polyps per patient    Current Medications: No outpatient medications have been marked as taking for the 05/26/22 encounter (Appointment) with [place], Tache Bobst M, MD.     Allergies:   Patient has no known allergies.   Social History   Socioeconomic History   Marital status: Married    Spouse name: Not on file   Number of children: Not on file   Years of education: Not on file   Highest education level: Not on file  Occupational History   Not on file  Tobacco Use   Smoking status: Never    Passive exposure: Never   Smokeless tobacco: Never  Substance and Sexual Activity   Alcohol use: Not Currently   Drug use: Never   Sexual activity: Not Currently  Other Topics Concern   Not on file  Social History Narrative   Not on file   Social Determinants of Health   Financial Resource Strain: Not on file  Food Insecurity: Not on file  Transportation Needs: Not on file  Physical Activity: Not on file  Stress: Not on file  Social Connections: Not on file     Family History: The patient's ***family history is not on file.  ROS:   Please see the history of present illness.    *** All other systems reviewed and are negative.  EKGs/Labs/Other  Studies Reviewed:    The following studies were reviewed today: ***  EKG:  EKG is *** ordered today.  The ekg ordered today demonstrates ***  Recent Labs: No results found for requested labs within last 365 days.  Recent Lipid Panel    Component Value Date/Time   CHOL 136 03/28/2009 0148   TRIG 59 03/28/2009 0148   HDL 62 03/28/2009 0148   CHOLHDL 2.2 Ratio 03/28/2009 0148   VLDL 12 03/28/2009 0148   LDLCALC 62 03/28/2009 0148   Dated 11/05/21: cholesterol 167, triglycerides 61, HDL 69, LDL 86. A1c 5.6%. creatinine 1.04. otherwise CMET and CBC normal.  Risk Assessment/Calculations:   {Does this patient have ATRIAL FIBRILLATION?:[phone]}  No BP recorded.  {Refresh Note OR Click here to enter BP  :1}***         Physical Exam:    VS:  There were no vitals taken for this visit.    Wt Readings from Last 3 Encounters:  04/28/22 128 lb 9.6 oz (58.3 kg)  07/04/06 129 lb (58.5 kg)     GEN: *** Well nourished, well developed in no acute distress HEENT: Normal NECK: No JVD; No carotid bruits LYMPHATICS: No lymphadenopathy CARDIAC: ***RRR, no murmurs, rubs, gallops RESPIRATORY:  Clear to  auscultation without rales, wheezing or rhonchi  ABDOMEN: Soft, non-tender, non-distended MUSCULOSKELETAL:  No edema; No deformity  SKIN: Warm and dry NEUROLOGIC:  Alert and oriented x 3 PSYCHIATRIC:  Normal affect   ASSESSMENT:    No diagnosis found. PLAN:    In order of problems listed above:  ***      {Are you ordering a CV Procedure (e.g. stress test, cath, DCCV, TEE, etc)?   Press F2        :[id number]}    Medication Adjustments/Labs and Tests Ordered: Current medicines are reviewed at length with the patient today.  Concerns regarding medicines are outlined above.  No orders of the defined types were placed in this encounter.  No orders of the defined types were placed in this encounter.   There are no Patient Instructions on file for this visit.   Signed, Sanaiya Welliver  [place], MD  05/22/2022 1:42 PM    [facility]

## 2022-05-26 ENCOUNTER — Encounter: Payer: Self-pay | Admitting: Cardiology

## 2022-05-26 ENCOUNTER — Ambulatory Visit: Payer: Medicare Other | Attending: Cardiology | Admitting: Cardiology

## 2022-05-26 VITALS — BP 130/84 | HR 60 | Ht 61.0 in | Wt 127.2 lb

## 2022-05-26 DIAGNOSIS — R011 Cardiac murmur, unspecified: Secondary | ICD-10-CM | POA: Insufficient documentation

## 2022-05-26 DIAGNOSIS — E7800 Pure hypercholesterolemia, unspecified: Secondary | ICD-10-CM | POA: Diagnosis not present

## 2022-05-26 DIAGNOSIS — I10 Essential (primary) hypertension: Secondary | ICD-10-CM | POA: Insufficient documentation

## 2022-05-26 NOTE — Patient Instructions (Signed)
Medication Instructions:  Continue same medications    Lab Work: None ordered   Testing/Procedures: Echo   Follow-Up: At [facility], you and your health needs are our priority.  As part of our continuing mission to provide you with exceptional heart care, we have created designated Provider Care Teams.  These Care Teams include your primary Cardiologist (physician) and Advanced Practice Providers (APPs -  Physician Assistants and Nurse Practitioners) who all work together to provide you with the care you need, when you need it.  We recommend signing up for the patient portal called "MyChart".  Sign up information is provided on this After Visit Summary.  MyChart is used to connect with patients for Virtual Visits (Telemedicine).  Patients are able to view lab/test results, encounter notes, upcoming appointments, etc.  Non-urgent messages can be sent to your provider as well.   To learn more about what you can do with MyChart, go to [email or web address].    Your next appointment:  1 year    Call in July to schedule Sept appointment     The format for your next appointment: Office    Provider:  Dr.Jordan   Important Information About Sugar

## 2022-05-27 DIAGNOSIS — E782 Mixed hyperlipidemia: Secondary | ICD-10-CM | POA: Diagnosis not present

## 2022-05-27 DIAGNOSIS — I10 Essential (primary) hypertension: Secondary | ICD-10-CM | POA: Diagnosis not present

## 2022-05-27 DIAGNOSIS — K589 Irritable bowel syndrome without diarrhea: Secondary | ICD-10-CM | POA: Diagnosis not present

## 2022-05-27 DIAGNOSIS — R011 Cardiac murmur, unspecified: Secondary | ICD-10-CM | POA: Diagnosis not present

## 2022-05-30 ENCOUNTER — Ambulatory Visit (HOSPITAL_COMMUNITY)
Admission: RE | Admit: 2022-05-30 | Discharge: 2022-05-30 | Disposition: A | Discharge status: Home / Self Care | Payer: Medicare Other | Source: Ambulatory Visit | Attending: Cardiology | Admitting: Cardiology

## 2022-05-30 DIAGNOSIS — I10 Essential (primary) hypertension: Secondary | ICD-10-CM | POA: Diagnosis not present

## 2022-05-30 DIAGNOSIS — R011 Cardiac murmur, unspecified: Secondary | ICD-10-CM

## 2022-05-30 DIAGNOSIS — E7800 Pure hypercholesterolemia, unspecified: Secondary | ICD-10-CM | POA: Diagnosis not present

## 2022-05-30 LAB — ECHOCARDIOGRAM COMPLETE
AR max vel: 2.36 cm2
AV Area VTI: 2.43 cm2
AV Area mean vel: 2.15 cm2
AV Mean grad: 3 mmHg
AV Peak grad: 6.9 mmHg
Ao pk vel: 1.31 m/s
Area-P 1/2: 2.87 cm2
MV VTI: 2.46 cm2
S' Lateral: 2.5 cm

## 2022-05-30 NOTE — Progress Notes (Signed)
*  PRELIMINARY RESULTS* Echocardiogram 2D Echocardiogram has been performed.  Carolyne Fiscal 05/30/2022, 1:23 PM

## 2022-06-02 NOTE — Patient Instructions (Signed)
Megan Mcguire  06/02/2022     @PREFPERIOPPHARMACY @   Your procedure is scheduled on  06/04/2022.   Report to [facility] at  0900  A.M.   Call this number if you have problems the morning of surgery:  [phone]   Remember:  Follow the diet instructions given to you by the office.     Take these medicines the morning of surgery with A SIP OF WATER                       amlodipine, librax, metoprolol, protonix.     Do not wear jewelry, make-up or nail polish.  Do not wear lotions, powders, or perfumes, or deodorant.  Do not shave 48 hours prior to surgery.  Men may shave face and neck.  Do not bring valuables to the hospital.  [facility] is not responsible for any belongings or valuables.  Contacts, dentures or bridgework may not be worn into surgery.  Leave your suitcase in the car.  After surgery it may be brought to your room.  For patients admitted to the hospital, discharge time will be determined by your treatment team.  Patients discharged the day of surgery will not be allowed to drive home and must have someone with them for 24 hours.    Special instructions:   DO NOT smoke tobacco or vape for 24 hours before your procedure.  Please read over the following fact sheets that you were given. Anesthesia Post-op Instructions and Care and Recovery After Surgery      Upper Endoscopy, Adult, Care After After the procedure, it is common to have a sore throat. It is also common to have: Mild stomach pain or discomfort. Bloating. Nausea. Follow these instructions at home: The instructions below may help you care for yourself at home. Your health care provider may give you more instructions. If you have questions, ask your health care provider. If you were given a sedative during the procedure, it can affect you for several hours. Do not drive or operate machinery until your health care provider says that it is safe. If you will be going home right after the  procedure, plan to have a responsible adult: Take you home from the hospital or clinic. You will not be allowed to drive. Care for you for the time you are told. Follow instructions from your health care provider about what you may eat and drink. Return to your normal activities as told by your health care provider. Ask your health care provider what activities are safe for you. Take over-the-counter and prescription medicines only as told by your health care provider. Contact a health care provider if you: Have a sore throat that lasts longer than one day. Have trouble swallowing. Have a fever. Get help right away if you: Vomit blood or your vomit looks like coffee grounds. Have bloody, black, or tarry stools. Have a very bad sore throat or you cannot swallow. Have difficulty breathing or very bad pain in your chest or abdomen. These symptoms may be an emergency. Get help right away. Call 911. Do not wait to see if the symptoms will go away. Do not drive yourself to the hospital. Summary After the procedure, it is common to have a sore throat, mild stomach discomfort, bloating, and nausea. If you were given a sedative during the procedure, it can affect you for several hours. Do not drive until your health care provider  says that it is safe. Follow instructions from your health care provider about what you may eat and drink. Return to your normal activities as told by your health care provider. This information is not intended to replace advice given to you by your health care provider. Make sure you discuss any questions you have with your health care provider. Document Revised: 12/11/2021 Document Reviewed: 12/11/2021 Elsevier Patient Education  [name] After This sheet gives you information about how to care for yourself after your procedure. Your health care provider may also give you more specific instructions. If you have problems or  questions, contact your health care provider. What can I expect after the procedure? After the procedure, it is common to have: Tiredness. Forgetfulness about what happened after the procedure. Impaired judgment for important decisions. Nausea or vomiting. Some difficulty with balance. Follow these instructions at home: For the time period you were told by your health care provider:     Rest as needed. Do not participate in activities where you could fall or become injured. Do not drive or use machinery. Do not drink alcohol. Do not take sleeping pills or medicines that cause drowsiness. Do not make important decisions or sign legal documents. Do not take care of children on your own. Eating and drinking Follow the diet that is recommended by your health care provider. Drink enough fluid to keep your urine pale yellow. If you vomit: Drink water, juice, or soup when you can drink without vomiting. Make sure you have little or no nausea before eating solid foods. General instructions Have a responsible adult stay with you for the time you are told. It is important to have someone help care for you until you are awake and alert. Take over-the-counter and prescription medicines only as told by your health care provider. If you have sleep apnea, surgery and certain medicines can increase your risk for breathing problems. Follow instructions from your health care provider about wearing your sleep device: Anytime you are sleeping, including during daytime naps. While taking prescription pain medicines, sleeping medicines, or medicines that make you drowsy. Avoid smoking. Keep all follow-up visits as told by your health care provider. This is important. Contact a health care provider if: You keep feeling nauseous or you keep vomiting. You feel light-headed. You are still sleepy or having trouble with balance after 24 hours. You develop a rash. You have a fever. You have redness or  swelling around the IV site. Get help right away if: You have trouble breathing. You have new-onset confusion at home. Summary For several hours after your procedure, you may feel tired. You may also be forgetful and have poor judgment. Have a responsible adult stay with you for the time you are told. It is important to have someone help care for you until you are awake and alert. Rest as told. Do not drive or operate machinery. Do not drink alcohol or take sleeping pills. Get help right away if you have trouble breathing, or if you suddenly become confused. This information is not intended to replace advice given to you by your health care provider. Make sure you discuss any questions you have with your health care provider. Document Revised: 08/06/2021 Document Reviewed: 08/04/2019 Elsevier Patient Education  [place].

## 2022-06-03 ENCOUNTER — Encounter (HOSPITAL_COMMUNITY): Payer: Self-pay

## 2022-06-03 ENCOUNTER — Encounter (HOSPITAL_COMMUNITY)
Admission: RE | Admit: 2022-06-03 | Discharge: 2022-06-03 | Disposition: A | Discharge status: Home / Self Care | Payer: Medicare Other | Source: Ambulatory Visit | Attending: Internal Medicine | Admitting: Internal Medicine

## 2022-06-03 VITALS — BP 157/70 | HR 63 | Temp 97.7°F | Resp 18 | Ht 61.0 in | Wt 127.2 lb

## 2022-06-03 DIAGNOSIS — N189 Chronic kidney disease, unspecified: Secondary | ICD-10-CM

## 2022-06-03 DIAGNOSIS — Z01812 Encounter for preprocedural laboratory examination: Secondary | ICD-10-CM | POA: Insufficient documentation

## 2022-06-03 LAB — BASIC METABOLIC PANEL
Anion gap: 10 (ref 5–15)
BUN: 19 mg/dL (ref 8–23)
CO2: 26 mmol/L (ref 22–32)
Calcium: 9.4 mg/dL (ref 8.9–10.3)
Chloride: 104 mmol/L (ref 98–111)
Creatinine, Ser: 1.29 mg/dL — ABNORMAL HIGH (ref 0.44–1.00)
GFR, Estimated: 40 mL/min — ABNORMAL LOW (ref >60)
Glucose, Bld: 157 mg/dL — ABNORMAL HIGH (ref 70–99)
Potassium: 3.4 mmol/L — ABNORMAL LOW (ref 3.5–5.1)
Sodium: 140 mmol/L (ref 135–145)

## 2022-06-04 ENCOUNTER — Ambulatory Visit (HOSPITAL_COMMUNITY): Payer: Medicare Other | Admitting: Anesthesiology

## 2022-06-04 ENCOUNTER — Encounter (HOSPITAL_COMMUNITY): Payer: Self-pay | Admitting: Internal Medicine

## 2022-06-04 ENCOUNTER — Other Ambulatory Visit: Payer: Self-pay

## 2022-06-04 ENCOUNTER — Ambulatory Visit (HOSPITAL_COMMUNITY)
Admission: RE | Admit: 2022-06-04 | Discharge: 2022-06-04 | Disposition: A | Discharge status: Home / Self Care | Payer: Medicare Other | Attending: Internal Medicine | Admitting: Internal Medicine

## 2022-06-04 ENCOUNTER — Encounter (HOSPITAL_COMMUNITY)
Admission: RE | Disposition: A | Discharge status: Home / Self Care | Payer: Self-pay | Source: Home / Self Care | Attending: Internal Medicine

## 2022-06-04 ENCOUNTER — Ambulatory Visit (HOSPITAL_BASED_OUTPATIENT_CLINIC_OR_DEPARTMENT_OTHER): Payer: Medicare Other | Admitting: Anesthesiology

## 2022-06-04 DIAGNOSIS — I129 Hypertensive chronic kidney disease with stage 1 through stage 4 chronic kidney disease, or unspecified chronic kidney disease: Secondary | ICD-10-CM | POA: Insufficient documentation

## 2022-06-04 DIAGNOSIS — K222 Esophageal obstruction: Secondary | ICD-10-CM | POA: Insufficient documentation

## 2022-06-04 DIAGNOSIS — K449 Diaphragmatic hernia without obstruction or gangrene: Secondary | ICD-10-CM

## 2022-06-04 DIAGNOSIS — Z79899 Other long term (current) drug therapy: Secondary | ICD-10-CM | POA: Insufficient documentation

## 2022-06-04 DIAGNOSIS — K219 Gastro-esophageal reflux disease without esophagitis: Secondary | ICD-10-CM | POA: Insufficient documentation

## 2022-06-04 DIAGNOSIS — N189 Chronic kidney disease, unspecified: Secondary | ICD-10-CM | POA: Diagnosis not present

## 2022-06-04 DIAGNOSIS — K2289 Other specified disease of esophagus: Secondary | ICD-10-CM | POA: Diagnosis not present

## 2022-06-04 DIAGNOSIS — R1013 Epigastric pain: Secondary | ICD-10-CM | POA: Insufficient documentation

## 2022-06-04 DIAGNOSIS — K3189 Other diseases of stomach and duodenum: Secondary | ICD-10-CM | POA: Diagnosis not present

## 2022-06-04 DIAGNOSIS — K319 Disease of stomach and duodenum, unspecified: Secondary | ICD-10-CM | POA: Diagnosis not present

## 2022-06-04 HISTORY — PX: BIOPSY: SHX5522

## 2022-06-04 HISTORY — DX: Cardiac murmur, unspecified: R01.1

## 2022-06-04 HISTORY — PX: ESOPHAGOGASTRODUODENOSCOPY (EGD) WITH PROPOFOL: SHX5813

## 2022-06-04 SURGERY — ESOPHAGOGASTRODUODENOSCOPY (EGD) WITH PROPOFOL
Anesthesia: General

## 2022-06-04 MED ORDER — PROPOFOL 10 MG/ML IV BOLUS
INTRAVENOUS | Status: DC | PRN
  Administered 2022-06-04: 80 mg via INTRAVENOUS
  Administered 2022-06-04: 40 mg via INTRAVENOUS

## 2022-06-04 MED ORDER — LIDOCAINE HCL 1 % IJ SOLN
INTRAMUSCULAR | Status: DC | PRN
  Administered 2022-06-04: 50 mg via INTRADERMAL

## 2022-06-04 MED ORDER — LACTATED RINGERS IV SOLN: INTRAVENOUS | Status: DC | PRN

## 2022-06-04 NOTE — Anesthesia Preprocedure Evaluation (Signed)
Anesthesia Evaluation  Patient identified by MRN, date of birth, ID band Patient awake    Reviewed: Allergy & Precautions, H&P , NPO status , Patient's Chart, lab work & pertinent test results, reviewed documented beta blocker date and time   Airway Mallampati: II  TM Distance: >3 FB Neck ROM: full    Dental no notable dental hx.    Pulmonary neg pulmonary ROS,    Pulmonary exam normal breath sounds clear to auscultation       Cardiovascular Exercise Tolerance: Good hypertension, negative cardio ROS   Rhythm:regular Rate:Normal     Neuro/Psych negative neurological ROS  negative psych ROS   GI/Hepatic Neg liver ROS, GERD  Medicated,  Endo/Other  negative endocrine ROS  Renal/GU CRFRenal disease  negative genitourinary   Musculoskeletal   Abdominal   Peds  Hematology negative hematology ROS (+)   Anesthesia Other Findings   Reproductive/Obstetrics negative OB ROS                             Anesthesia Physical Anesthesia Plan  ASA: 3  Anesthesia Plan: General   Post-op Pain Management:    Induction:   PONV Risk Score and Plan: Propofol infusion  Airway Management Planned:   Additional Equipment:   Intra-op Plan:   Post-operative Plan:   Informed Consent: I have reviewed the patients History and Physical, chart, labs and discussed the procedure including the risks, benefits and alternatives for the proposed anesthesia with the patient or authorized representative who has indicated his/her understanding and acceptance.     Dental Advisory Given  Plan Discussed with: CRNA  Anesthesia Plan Comments:         Anesthesia Quick Evaluation

## 2022-06-04 NOTE — Op Note (Signed)
[facility] Patient Name: Megan Mcguire Procedure Date: 06/04/2022 11:25 AM MRN: [id number] Date of Birth: 1930/09/20 Attending MD: Norvel Richards , MD CSN: [id number] Age: 88 Admit Type: Outpatient Procedure:                Upper GI endoscopy Indications:              Dyspepsia Providers:                Norvel Richards, MD, Lambert Mody, Starla Link RN, RN, Raphael Gibney, Technician Referring MD:              Medicines:                Propofol per Anesthesia Complications:            No immediate complications. Estimated Blood Loss:     Estimated blood loss was minimal. Procedure:                Pre-Anesthesia Assessment:                           - Prior to the procedure, a History and Physical                            was performed, and patient medications and                            allergies were reviewed. The patient's tolerance of                            previous anesthesia was also reviewed. The risks                            and benefits of the procedure and the sedation                            options and risks were discussed with the patient.                            All questions were answered, and informed consent                            was obtained. Prior Anticoagulants: The patient has                            taken no previous anticoagulant or antiplatelet                            agents. ASA Grade Assessment: III - A patient with                            severe systemic disease. After reviewing the risks  and benefits, the patient was deemed in                            satisfactory condition to undergo the procedure.                           After obtaining informed consent, the endoscope was                            passed under direct vision. Throughout the                            procedure, the patient's blood pressure, pulse, and                             oxygen saturations were monitored continuously. The                            [id number] scope was introduced through the                            mouth, and advanced to the second part of duodenum.                            The upper GI endoscopy was accomplished without                            difficulty. The patient tolerated the procedure                            well. Scope In: 11:42:27 AM Scope Out: 11:51:44 AM Total Procedure Duration: 0 hours 9 minutes 17 seconds  Findings:      7 mm nodule just above the GE junction. Schatzki's ring?"nonobstructing.      Stomach empty. Small hiatal hernia. Patchy scattered erythema. No ulcer       or infiltrating process. Patent pylorus.      The duodenal bulb and second portion of the duodenum were normal. Impression:               - Distal esophageal nodule uncertain                            significance?"status post biopsy. Noncritical                            Schatzki's ring?"not manipulated. Small hiatal                            hernia. Patchy gastric erythema?"status post biopsy;                            normal duodenal bulb and second portion of the                            duodenum aside from single prominent lymphangitic  appearing lesion in the second portion of the                            duodenum?"biopsied..                           - No specimens collected. Moderate Sedation:      Moderate (conscious) sedation was personally administered by an       anesthesia professional. The following parameters were monitored: oxygen       saturation, heart rate, blood pressure, respiratory rate, EKG, adequacy       of pulmonary ventilation, and response to care. Recommendation:           - Patient has a contact number available for                            emergencies. The signs and symptoms of potential                            delayed complications were discussed with the                             patient. Return to normal activities tomorrow.                            Written discharge instructions were provided to the                            patient.                           - Resume previous diet.                           - Continue present medications. Follow-up on                            pathology.                           - Return to my office in 6 weeks. Procedure Code(s):        --- Professional ---                           [phone], Esophagogastroduodenoscopy, flexible,                            transoral; diagnostic, including collection of                            specimen(s) by brushing or washing, when performed                            (separate procedure) Diagnosis Code(s):        --- Professional ---  R10.13, Epigastric pain CPT copyright 2019 American Medical Association. All rights reserved. The codes documented in this report are preliminary and upon coder review may  be revised to meet current compliance requirements. Cristopher Estimable. Laelah Siravo, MD Norvel Richards, MD 06/04/2022 11:59:28 AM This report has been signed electronically. Number of Addenda: 0

## 2022-06-04 NOTE — Transfer of Care (Addendum)
Immediate Anesthesia Transfer of Care Note  Patient: Megan Mcguire  Procedure(s) Performed: ESOPHAGOGASTRODUODENOSCOPY (EGD) WITH PROPOFOL BIOPSY  Patient Location: Short Stay  Anesthesia Type:General  Level of Consciousness: awake  Airway & Oxygen Therapy: Patient Spontanous Breathing  Post-op Assessment: Report given to RN  Post vital signs: Reviewed and stable  Last Vitals:  Vitals Value Taken Time  BP 98/67   Temp 37.2   Pulse 52   Resp 16   SpO2 96     Last Pain:  Vitals:   06/04/22 1139  TempSrc:   PainSc: 0-No pain      Patients Stated Pain Goal: 9 (16/38/45 3646)  Complications: No notable events documented.

## 2022-06-04 NOTE — Discharge Instructions (Signed)
EGD Discharge instructions Please read the instructions outlined below and refer to this sheet in the next few weeks. These discharge instructions provide you with general information on caring for yourself after you leave the hospital. Your doctor may also give you specific instructions. While your treatment has been planned according to the most current medical practices available, unavoidable complications occasionally occur. If you have any problems or questions after discharge, please call your doctor. ACTIVITY You may resume your regular activity but move at a slower pace for the next 24 hours.  Take frequent rest periods for the next 24 hours.  Walking will help expel (get rid of) the air and reduce the bloated feeling in your abdomen.  No driving for 24 hours (because of the anesthesia (medicine) used during the test).  You may shower.  Do not sign any important legal documents or operate any machinery for 24 hours (because of the anesthesia used during the test).  NUTRITION Drink plenty of fluids.  You may resume your normal diet.  Begin with a light meal and progress to your normal diet.  Avoid alcoholic beverages for 24 hours or as instructed by your caregiver.  MEDICATIONS You may resume your normal medications unless your caregiver tells you otherwise.  WHAT YOU CAN EXPECT TODAY You may experience abdominal discomfort such as a feeling of fullness or "gas" pains.  FOLLOW-UP Your doctor will discuss the results of your test with you.  SEEK IMMEDIATE MEDICAL ATTENTION IF ANY OF THE FOLLOWING OCCUR: Excessive nausea (feeling sick to your stomach) and/or vomiting.  Severe abdominal pain and distention (swelling).  Trouble swallowing.  Temperature over 101 F (37.8 C).  Rectal bleeding or vomiting of blood.     Biopsies of your esophagus stomach and duodenum were taken.  Your stomach appeared a little inflamed.  Further recommendations to follow next week once I get the  reports back for review.  Office visit with [place] in 6 weeks  At patient request, I called Vanita Panda at [phone]-reviewed findings and recommendations

## 2022-06-04 NOTE — H&P (Signed)
@LOGO @   Primary Care Physician:  , MD Primary Gastroenterologist:  Dr. Benita Stabile  Pre-Procedure History & Physical: HPI:  Megan Mcguire is a 88 y.o. female here for for further evaluation of epigastric pain and inadequately controlled GERD.  No dysphagia.  Bowel function much better with the addition of MiraLAX.  Past Medical History:  Diagnosis Date   CKD (chronic kidney disease)    GERD (gastroesophageal reflux disease)    Heart murmur    HLD (hyperlipidemia)    HTN (hypertension)     Past Surgical History:  Procedure Laterality Date   ABDOMINAL HYSTERECTOMY     COLONOSCOPY     years ago with polyps per patient    Prior to Admission medications   Medication Sig Start Date End Date Taking? Authorizing Provider  amLODipine (NORVASC) 10 MG tablet Take 10 mg by mouth daily. 12/25/21  Yes [provider]  aspirin EC 81 MG tablet Take 81 mg by mouth daily.   Yes [provider]  atorvastatin (LIPITOR) 10 MG tablet Take 10 mg by mouth daily. 03/10/22  Yes [provider]  cholecalciferol (VITAMIN D3) 25 MCG (1000 UNIT) tablet Take 1,000 Units by mouth daily.   Yes [provider]  clidinium-chlordiazePOXIDE (LIBRAX) 5-2.5 MG capsule Take 1 capsule by mouth in the morning and at bedtime. 03/24/22  Yes [provider]  metoprolol succinate (TOPROL-XL) 50 MG 24 hr tablet Take 50 mg by mouth daily. 02/13/22  Yes [provider]  Multiple Vitamin (MULTIVITAMIN WITH MINERALS) TABS tablet Take 1 tablet by mouth daily.   Yes [provider]  nystatin (MYCOSTATIN) 100000 UNIT/ML suspension Take 5 mLs by mouth 4 (four) times daily. 04/22/22  Yes [provider]  olmesartan (BENICAR) 40 MG tablet Take 40 mg by mouth daily.   Yes [provider]  polyethylene glycol (MIRALAX / GLYCOLAX) 17 g packet Take 17 g by mouth daily.   Yes [provider]  amLODipine-olmesartan (AZOR) 10-40 MG tablet Take 1  tablet by mouth daily. 05/27/22   [provider]  pantoprazole (PROTONIX) 40 MG tablet Take 40 mg by mouth 2 (two) times daily. 03/24/22   [provider]    Allergies as of 04/28/2022   (No Known Allergies)    Family History  Problem Relation Age of Onset   Hypertension Mother    Heart failure Father     Social History   Socioeconomic History   Marital status: Married    Spouse name: Not on file   Number of children: 6   Years of education: Not on file   Highest education level: Not on file  Occupational History   Not on file  Tobacco Use   Smoking status: Never    Passive exposure: Never   Smokeless tobacco: Never  Vaping Use   Vaping Use: Never used  Substance and Sexual Activity   Alcohol use: Not Currently   Drug use: Never   Sexual activity: Not Currently  Other Topics Concern   Not on file  Social History Narrative   Not on file   Social Determinants of Health   Financial Resource Strain: Not on file  Food Insecurity: Not on file  Transportation Needs: Not on file  Physical Activity: Not on file  Stress: Not on file  Social Connections: Not on file  Intimate Partner Violence: Not on file    Review of Systems: See HPI, otherwise negative ROS  Physical Exam: BP (!) 162/75  Pulse 73   Temp 98.2 F (36.8 C) (Oral)   Resp 17   SpO2 100%  General:   Alert,  Well-developed, well-nourished, pleasant and cooperative in NAD Skin:  Intact without significant lesions or rashes. Eyes:  Sclera clear, no icterus.   Conjunctiva pink. Ears:  Normal auditory acuity. Nose:  No deformity, discharge,  or lesions. Mouth:  No deformity or lesions. Neck:  Supple; no masses or thyromegaly. No significant cervical adenopathy. Lungs:  Clear throughout to auscultation.   No wheezes, crackles, or rhonchi. No acute distress. Heart:  Regular rate and rhythm; no murmurs, clicks, rubs,  or gallops. Abdomen: Non-distended, normal bowel sounds.  Soft and  nontender without appreciable mass or hepatosplenomegaly.  Pulses:  Normal pulses noted. Extremities:  Without clubbing or edema.  Impression/Plan: Epigastric bloating and burning with GERD inadequately controlled with PPI in an 88 year old lady.  Constipation now much better with MiraLAX. She denies dysphagia. Per plan, I have offered her a diagnostic EGD today.     The risks, benefits, limitations, alternatives and imponderables have been reviewed with the patient. Potential for esophageal dilation, biopsy, etc. have also been reviewed.  Questions have been answered. All parties agreeable.       Notice: This dictation was prepared with Dragon dictation along with smaller phrase technology. Any transcriptional errors that result from this process are unintentional and may not be corrected upon review.

## 2022-06-04 NOTE — Anesthesia Postprocedure Evaluation (Signed)
Anesthesia Post Note  Patient: Megan Mcguire  Procedure(s) Performed: ESOPHAGOGASTRODUODENOSCOPY (EGD) WITH PROPOFOL BIOPSY  Patient location during evaluation: Short Stay Anesthesia Type: General Level of consciousness: awake and alert Pain management: pain level controlled Vital Signs Assessment: post-procedure vital signs reviewed and stable Respiratory status: spontaneous breathing Cardiovascular status: blood pressure returned to baseline and stable Anesthetic complications: no   No notable events documented.   Last Vitals:  Vitals:   06/04/22 1159 06/04/22 1208  BP: 98/67 112/70  Pulse: (!) 52   Resp: 16   Temp: 37.2 C   SpO2: 96%     Last Pain:  Vitals:   06/04/22 1159  TempSrc: Oral  PainSc: 0-No pain                 Cosby Proby

## 2022-06-05 LAB — SURGICAL PATHOLOGY

## 2022-06-06 ENCOUNTER — Encounter: Payer: Self-pay | Admitting: Internal Medicine

## 2022-06-12 ENCOUNTER — Encounter (HOSPITAL_COMMUNITY): Payer: Self-pay | Admitting: Internal Medicine

## 2022-07-16 ENCOUNTER — Encounter: Payer: Self-pay | Admitting: Gastroenterology

## 2022-07-16 ENCOUNTER — Ambulatory Visit (INDEPENDENT_AMBULATORY_CARE_PROVIDER_SITE_OTHER): Payer: Medicare Other | Admitting: Gastroenterology

## 2022-07-16 VITALS — BP 164/79 | HR 71 | Temp 97.9°F | Ht 61.0 in | Wt 131.4 lb

## 2022-07-16 DIAGNOSIS — K219 Gastro-esophageal reflux disease without esophagitis: Secondary | ICD-10-CM | POA: Diagnosis not present

## 2022-07-16 DIAGNOSIS — R1012 Left upper quadrant pain: Secondary | ICD-10-CM

## 2022-07-16 DIAGNOSIS — K5900 Constipation, unspecified: Secondary | ICD-10-CM

## 2022-07-16 NOTE — Patient Instructions (Signed)
Reduce pantoprazole to 40mg  ONCE daily before breakfast. If you start having more burning in the upper abdomen or heartburn, you can go back to TWICE daily.  Please limit foods listed below as they can cause increased gas.  Purchase Gas-X over the counter. Take according to package instructions to see if helps with your gas pocket.  Continue Miralax for treatment of your constipation.  Return to see Dr. Gala Romney in 6 weeks.   Foods to avoid/limit due to increasing gas. Fruits Fresh, dried, and juiced forms of apple, pear, watermelon, peach, plum, cherries, apricots, blackberries, boysenberries, figs, nectarines, and mango. Avocado. Vegetables Chicory root, artichoke, asparagus, cabbage, snow peas, Brussels sprouts, broccoli, sugar snap peas, mushrooms, celery, and cauliflower. Onions, garlic, leeks, and the white part of scallions. Grains Wheat, including kamut, durum, and semolina. Barley and bulgur. Couscous. Wheat-based cereals. Wheat noodles, bread, crackers, and pastries. Meats and other proteins Fried or fatty meat. Sausage. Cashews and pistachios. Soybeans, baked beans, black beans, chickpeas, kidney beans, fava beans, navy beans, lentils, black-eyed peas, and split peas. Dairy Milk, yogurt, ice cream, and soft cheese. Cream and sour cream. Milk-based sauces. Custard. Buttermilk. Soy milk. Seasoning and other foods Any sugar-free gum or candy. Foods that contain artificial sweeteners such as sorbitol, mannitol, isomalt, or xylitol. Foods that contain honey, high-fructose corn syrup, or agave. Bouillon, vegetable stock, beef stock, and chicken stock. Garlic and onion powder. Condiments made with onion, such as hummus, chutney, pickles, relish, salad dressing, and salsa. Tomato paste. Beverages Chicory-based drinks. Coffee substitutes. Chamomile tea. Fennel tea. Sweet or fortified wines such as port or sherry. Diet soft drinks made with isomalt, mannitol, maltitol, sorbitol, or xylitol. Apple,  pear, and mango juice. Juices with high-fructose corn syrup.

## 2022-07-16 NOTE — Progress Notes (Signed)
GI Office Note    Referring Provider: Celene Squibb, MD Primary Care Physician:  Celene Squibb, MD  Primary Gastroenterologist: Garfield Cornea, MD   Chief Complaint   Chief Complaint  Patient presents with   Follow-up    Has gas "pockets" still    History of Present Illness   Megan Mcguire is a 88 y.o. female presenting today for follow-up.  She was seen back in August with complaints of chronic GERD.  Complained of epigastric burning radiated to the left upper quadrant for 1 year.  Typical heartburn.  Constipation managed with prune juice as needed, BMs every 3 to 4 days.  Overall doing better. Weight is up 4 pounds. No burning type pain now. LUQ gas pocket/quivering. Does not hurt. More of a nuisance. BM daily on Miralax may skip a day if stools regular. No n/v. Appetite fair. Feels better after eating. No fluid. No dysphagia. No melena, brbpr.   EGD September 2023: -Distal esophageal nodule uncertain significance status post biopsy.  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  -Small hiatal hernia.  -Patchy gastric erythema status post biopsy; mild nonspecific reactive gastropathy, no H. pylori -normal duodenal bulb and second portion of the duodenum aside from single prominent lymphangitic appearing lesion in the second portion of the duodenum, biopsy c/w duodenal lymphangiectasia    Medications   Current Outpatient Medications  Medication Sig Dispense Refill   amLODipine-olmesartan (AZOR) 10-40 MG tablet Take 1 tablet by mouth daily.     aspirin EC 81 MG tablet Take 81 mg by mouth daily.     atorvastatin (LIPITOR) 10 MG tablet Take 10 mg by mouth daily.     cholecalciferol (VITAMIN D3) 25 MCG (1000 UNIT) tablet Take 1,000 Units by mouth daily.     clidinium-chlordiazePOXIDE (LIBRAX) 5-2.5 MG capsule Take 1 capsule by mouth in the morning and at bedtime.     metoprolol succinate (TOPROL-XL)  50 MG 24 hr tablet Take 50 mg by mouth daily.     pantoprazole (PROTONIX) 40 MG tablet Take 40 mg by mouth 2 (two) times daily.     polyethylene glycol (MIRALAX / GLYCOLAX) 17 g packet Take 17 g by mouth daily.     No current facility-administered medications for this visit.    Allergies   Allergies as of 07/16/2022   (No Known Allergies)      Review of Systems   General: Negative for anorexia, weight loss, fever, chills, fatigue, weakness. ENT: Negative for hoarseness, difficulty swallowing , nasal congestion. CV: Negative for chest pain, angina, palpitations, dyspnea on exertion, peripheral edema.  Respiratory: Negative for dyspnea at rest, dyspnea on exertion, cough, sputum, wheezing.  GI: See history of present illness. GU:  Negative for dysuria, hematuria, urinary incontinence, urinary frequency, nocturnal urination.  Endo: Negative for unusual weight change.     Physical Exam   BP (!) 164/79 (BP Location: Right Arm, Patient Position: Sitting, Cuff Size: Normal)   Pulse 71   Temp 97.9 F (36.6 C) (Oral)   Ht 5\' 1"  (1.549 m)   Wt 131 lb 6.4 oz (59.6 kg)   SpO2 96%   BMI 24.83 kg/m    General: Well-nourished, well-developed in no acute distress.  Eyes: No icterus. Mouth: Oropharyngeal mucosa moist and pink , no lesions erythema or exudate. Abdomen: Bowel sounds are normal, nontender, nondistended, no hepatosplenomegaly or masses,  no abdominal bruits or hernia , no rebound or guarding.  Rectal: not performed Extremities: No lower extremity edema. No clubbing or deformities. Neuro: Alert and oriented x 4   Skin: Warm and dry, no jaundice.   Psych: Alert and cooperative, normal mood and affect.  Labs   None  Imaging Studies   No results found.  Assessment   GERD: doing well. We will have her reduce PPI to once daily if tolerated. Continue antireflux measures.  LUQ pain: pain resolved. She has a quivering feeling at times. She feels like it is gas and likely  correct. We will have her limit gas producing foods. She can try gas-x. Call with any recurrent pain.   Constipation: continue miralax daily as needed.    PLAN    Reduce pantoprazole to 40mg  daily. If recurrent burning in upper abdomen she can go back to BID. Limit high fodmap foods.  Trial of gas-x prn. Continue miralax one capful daily as needed. Return ov in six weeks with Dr. .  If any significant recurrent abdominal discomfort, could consider CT imaging.    Jena Gauss. Leanna Battles, MHS, PA-C [facility]

## 2022-07-20 ENCOUNTER — Encounter: Payer: Self-pay | Admitting: Gastroenterology

## 2022-08-15 ENCOUNTER — Ambulatory Visit (INDEPENDENT_AMBULATORY_CARE_PROVIDER_SITE_OTHER): Payer: Medicare Other | Admitting: Internal Medicine

## 2022-08-15 ENCOUNTER — Encounter: Payer: Self-pay | Admitting: Internal Medicine

## 2022-08-15 VITALS — BP 127/77 | HR 62 | Temp 97.9°F | Ht 61.0 in | Wt 129.2 lb

## 2022-08-15 DIAGNOSIS — K219 Gastro-esophageal reflux disease without esophagitis: Secondary | ICD-10-CM | POA: Diagnosis not present

## 2022-08-15 DIAGNOSIS — K9289 Other specified diseases of the digestive system: Secondary | ICD-10-CM

## 2022-08-15 NOTE — Patient Instructions (Signed)
It was nice to see you again today!  I believe Librax is a good treatment for you.  You can increase it to 3 or even 4 times a day (no more than 4 times daily) as needed for bowel symptoms  Continue Protonix 40 mg daily best taken 30 minutes before breakfast  May continue using Gas-X as needed  May also try IBgard similar over-the-counter agent containing peppermint oral to calm your bowels.  Office visit with [place] in 3 months

## 2022-08-15 NOTE — Progress Notes (Signed)
Primary Care Physician:  Benita Stabile, MD Primary Gastroenterologist:  Dr. Jena Gauss  Pre-Procedure History & Physical: HPI:  Megan Mcguire is a 88 y.o. female here for follow-up of dyspeptic symptoms and "gas pocket".  EGD recently demonstrated no ulcer or nonspecific gastropathy on biopsies no H. pylori.  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  Patient denies dysphagia; takes Protonix 40 mg once daily with good control of reflux symptoms. Planes of gas pockets in her abdomen that which move around.  Denies constipation.  She reminded me that she is taking Librax twice daily prescribed in [place] many years ago.  Has been getting refills to Dr. Scharlene Gloss office.  Has had any melena or rectal bleeding.  Reportedly had a colonoscopy in 2003 by Dr. Janeice Robinson.  I cannot find the report.  She denies frank abdominal pain.  She has been using Gas-X with improvement in gas symptoms.  She tells me she worries all the time and wonders if Megan Mcguire can contribute to any of her symptoms.  BP elevated today.  Past Medical History:  Diagnosis Date   CKD (chronic kidney disease)    GERD (gastroesophageal reflux disease)    Heart murmur    HLD (hyperlipidemia)    HTN (hypertension)     Past Surgical History:  Procedure Laterality Date   ABDOMINAL HYSTERECTOMY     BIOPSY  06/04/2022   Procedure: BIOPSY;  Surgeon: Corbin Ade, MD;  [facility];  Service: Endoscopy;;   COLONOSCOPY     years ago with polyps per patient   ESOPHAGOGASTRODUODENOSCOPY (EGD) WITH PROPOFOL N/A 06/04/2022   Procedure: ESOPHAGOGASTRODUODENOSCOPY (EGD) WITH PROPOFOL;  Surgeon: Corbin Ade, MD;  [facility];  Service: Endoscopy;  Laterality: N/A;  11:00 am    Prior to Admission medications   Medication Sig Start Date End Date Taking? Authorizing Provider  amLODipine-olmesartan (AZOR) 10-40 MG tablet Take 1 tablet by mouth daily. 05/27/22  Yes [provider]   aspirin EC 81 MG tablet Take 81 mg by mouth daily.   Yes [provider]  cholecalciferol (VITAMIN D3) 25 MCG (1000 UNIT) tablet Take 1,000 Units by mouth daily.   Yes [provider]  clidinium-chlordiazePOXIDE (LIBRAX) 5-2.5 MG capsule Take 1 capsule by mouth in the morning and at bedtime. 03/24/22  Yes [provider]  metoprolol succinate (TOPROL-XL) 50 MG 24 hr tablet Take 50 mg by mouth daily. 02/13/22  Yes [provider]  pantoprazole (PROTONIX) 40 MG tablet Take 40 mg by mouth 2 (two) times daily. 03/24/22  Yes [provider]  polyethylene glycol (MIRALAX / GLYCOLAX) 17 g packet Take 17 g by mouth daily.   Yes [provider]  Simethicone 125 MG CAPS Take 125 mg by mouth every 6 (six) hours as needed for flatulence.   Yes [provider]    Allergies as of 08/15/2022   (No Known Allergies)    Family History  Problem Relation Age of Onset   Hypertension Mother    Heart failure Father     Social History   Socioeconomic History   Marital status: Married    Spouse name: Not on file   Number of children: 6   Years of education: Not on file   Highest education level: Not on file  Occupational History   Not on file  Tobacco Use   Smoking status: Never    Passive exposure: Never   Smokeless tobacco: Never  Vaping  Use   Vaping Use: Never used  Substance and Sexual Activity   Alcohol use: Not Currently   Drug use: Never   Sexual activity: Not Currently  Other Topics Concern   Not on file  Social History Narrative   Not on file   Social Determinants of Health   Financial Resource Strain: Not on file  Food Insecurity: Not on file  Transportation Needs: Not on file  Physical Activity: Not on file  Stress: Not on file  Social Connections: Not on file  Intimate Partner Violence: Not on file    Review of Systems: See HPI, otherwise negative ROS  Physical Exam: BP (!) 151/81 (BP Location: Right Arm,  Patient Position: Sitting, Cuff Size: Normal)   Pulse 76   Temp 97.9 F (36.6 C) (Oral)   Ht 5\' 1"  (1.549 m)   Wt 129 lb 3.2 oz (58.6 kg)   SpO2 97%   BMI 24.41 kg/m  General:   Alert,  Well-developed, well-nourished, pleasant and cooperative in NAD Neck:  Supple; no masses or thyromegaly. No significant cervical adenopathy. Lungs:  Clear throughout to auscultation.   No wheezes, crackles, or rhonchi. No acute distress. Heart:  Regular rate and rhythm; no murmurs, clicks, rubs,  or gallops. Abdomen: Non-distended, normal bowel sounds.  Soft and nontender without appreciable mass or hepatosplenomegaly.  Pulses:  Normal pulses noted. Extremities:  Without clubbing or edema.  Impression/Plan: Pleasant 88 year old lady with dyspeptic symptoms fairly well-controlled on Protonix 40 mg once daily and Librax twice daily.  Gas-X as needed.  Really, no alarm symptoms.  Get a little more mileage about upping the dose of Librax which I think is an excellent treatment modality for this lady.  I see no reason for any further diagnostic/invasive studies at this time.  Recommendations  I believe Librax is a good treatment for you.  You can increase it to 3 or even 4 times a day (no more than 4 times daily) as needed for bowel symptoms  Continue Protonix 40 mg daily best taken 30 minutes before breakfast  May continue using Gas-X as needed  May also try IBgard similar over-the-counter agent containing peppermint oral to calm your bowels.  Office visit with [place] in 3 months  Recheck blood pressure today.  Notice: This dictation was prepared with Dragon dictation along with smaller phrase technology. Any transcriptional errors that result from this process are unintentional and may not be corrected upon review.

## 2022-08-26 ENCOUNTER — Ambulatory Visit: Payer: Medicare Other | Admitting: Internal Medicine

## 2022-10-22 DIAGNOSIS — R7301 Impaired fasting glucose: Secondary | ICD-10-CM | POA: Diagnosis not present

## 2022-10-22 DIAGNOSIS — E782 Mixed hyperlipidemia: Secondary | ICD-10-CM | POA: Diagnosis not present

## 2022-10-22 DIAGNOSIS — E559 Vitamin D deficiency, unspecified: Secondary | ICD-10-CM | POA: Diagnosis not present

## 2022-10-29 DIAGNOSIS — N1831 Chronic kidney disease, stage 3a: Secondary | ICD-10-CM | POA: Diagnosis not present

## 2022-10-29 DIAGNOSIS — I5030 Unspecified diastolic (congestive) heart failure: Secondary | ICD-10-CM | POA: Diagnosis not present

## 2022-10-29 DIAGNOSIS — K219 Gastro-esophageal reflux disease without esophagitis: Secondary | ICD-10-CM | POA: Diagnosis not present

## 2022-10-29 DIAGNOSIS — R809 Proteinuria, unspecified: Secondary | ICD-10-CM | POA: Diagnosis not present

## 2022-10-29 DIAGNOSIS — R011 Cardiac murmur, unspecified: Secondary | ICD-10-CM | POA: Diagnosis not present

## 2022-10-29 DIAGNOSIS — K5900 Constipation, unspecified: Secondary | ICD-10-CM | POA: Diagnosis not present

## 2022-10-29 DIAGNOSIS — I10 Essential (primary) hypertension: Secondary | ICD-10-CM | POA: Diagnosis not present

## 2022-10-29 DIAGNOSIS — E782 Mixed hyperlipidemia: Secondary | ICD-10-CM | POA: Diagnosis not present

## 2022-10-29 DIAGNOSIS — K580 Irritable bowel syndrome with diarrhea: Secondary | ICD-10-CM | POA: Diagnosis not present

## 2022-10-29 DIAGNOSIS — E870 Hyperosmolality and hypernatremia: Secondary | ICD-10-CM | POA: Diagnosis not present

## 2022-10-29 DIAGNOSIS — E559 Vitamin D deficiency, unspecified: Secondary | ICD-10-CM | POA: Diagnosis not present

## 2022-10-29 DIAGNOSIS — I130 Hypertensive heart and chronic kidney disease with heart failure and stage 1 through stage 4 chronic kidney disease, or unspecified chronic kidney disease: Secondary | ICD-10-CM | POA: Diagnosis not present

## 2022-11-13 NOTE — Progress Notes (Signed)
Referring Provider: Celene Squibb, MD Primary Care Physician:  Celene Squibb, MD Primary GI Physician: Dr. Gala Romney  Chief Complaint  Patient presents with   Follow-up    Stomach still burns sometimes     HPI:   Megan Mcguire is a 89 y.o. female with history of GERD, epigastric burning radiating to LUQ previously improved with PPI BID, constipation, "gas pockets" in stomach, presenting today for follow-up.   Last seen in our office 08/15/2022.  She reported gas pockets in her abdomen that move around.  She was taking Librax twice daily prescribed in [place] many years ago and had been getting refills at Dr. Juel Burrow office.  Had also been using Gas-X with improvement in symptoms.  Recommended taking Librax 3-4 times daily as needed, continuing Protonix daily, using Gas-X as needed, may try IBgard or similar over-the-counter agents containing peppermint oil, follow-up in 3 months.  Today:  Epigastric burning much improved.  Reflux well-controlled.  Feels bloated intermittently in the LUQ. Eventually passes gas or belches and symptom resolve. Getting a little better. Often notices it in the morning. Bowels moving fairly well, usually about every other day.  Takes MiraLAX every other day.  She does notice when her bowels move, her bloating is better.  She is not using Gas-X right now, but has in the past and it has helped.  Taking Librax twice daily.  States insurance would not pay for increased dosing that Dr. Gala Romney recommended.  No BRBPR, melena, vomiting, unintentional weight loss.  Reports she has a fungal infection in her mouth.  States this comes intermittently every few months.  She is been treated with nystatin in the past which works well.  She does have dentures and does not clean them daily.  EGD September 2023: -Distal esophageal nodule uncertain significance status post biopsy.  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  -Small hiatal hernia.  -Patchy gastric erythema status post biopsy; mild nonspecific reactive gastropathy, no H. pylori -normal duodenal bulb and second portion of the duodenum aside from single prominent lymphangitic appearing lesion in the second portion of the duodenum, biopsy c/w duodenal lymphangiectasia  Past Medical History:  Diagnosis Date   CKD (chronic kidney disease)    GERD (gastroesophageal reflux disease)    Heart murmur    HLD (hyperlipidemia)    HTN (hypertension)     Past Surgical History:  Procedure Laterality Date   ABDOMINAL HYSTERECTOMY     BIOPSY  06/04/2022   Procedure: BIOPSY;  Surgeon: Daneil Dolin, MD;  [facility];  Service: Endoscopy;;   COLONOSCOPY     years ago with polyps per patient   ESOPHAGOGASTRODUODENOSCOPY (EGD) WITH PROPOFOL N/A 06/04/2022   Procedure: ESOPHAGOGASTRODUODENOSCOPY (EGD) WITH PROPOFOL;  Surgeon: Daneil Dolin, MD;  [facility];  Service: Endoscopy;  Laterality: N/A;  11:00 am    Current Outpatient Medications  Medication Sig Dispense Refill   amLODipine-olmesartan (AZOR) 10-40 MG tablet Take 1 tablet by mouth daily.     aspirin EC 81 MG tablet Take 81 mg by mouth daily.     atorvastatin (LIPITOR) 10 MG tablet Take by mouth.     cholecalciferol (VITAMIN D3) 25 MCG (1000 UNIT) tablet Take 1,000 Units by mouth daily.     metoprolol succinate (TOPROL-XL) 50 MG 24 hr tablet Take 50 mg by mouth daily.     nystatin (MYCOSTATIN) 100000 UNIT/ML suspension Take 4 mLs (  400,000 Units total) by mouth 4 (four) times daily for 7 days. 60 mL 0   pantoprazole (PROTONIX) 40 MG tablet Take 40 mg by mouth 2 (two) times daily.     polyethylene glycol (MIRALAX / GLYCOLAX) 17 g packet Take 17 g by mouth daily.     Simethicone 125 MG CAPS Take 125 mg by mouth every 6 (six) hours as needed for flatulence.     clidinium-chlordiazePOXIDE (LIBRAX) 5-2.5 MG capsule Take 1 capsule by mouth in the morning and  at bedtime. 180 capsule 3   No current facility-administered medications for this visit.    Allergies as of 11/14/2022   (No Known Allergies)    Family History  Problem Relation Age of Onset   Hypertension Mother    Heart failure Father     Social History   Socioeconomic History   Marital status: Married    Spouse name: Not on file   Number of children: 6   Years of education: Not on file   Highest education level: Not on file  Occupational History   Not on file  Tobacco Use   Smoking status: Never    Passive exposure: Never   Smokeless tobacco: Never  Vaping Use   Vaping Use: Never used  Substance and Sexual Activity   Alcohol use: Not Currently   Drug use: Never   Sexual activity: Not Currently  Other Topics Concern   Not on file  Social History Narrative   Not on file   Social Determinants of Health   Financial Resource Strain: Not on file  Food Insecurity: Not on file  Transportation Needs: Not on file  Physical Activity: Not on file  Stress: Not on file  Social Connections: Not on file    Review of Systems: Gen: Denies fever, chills, cold or flulike symptoms, presyncope, syncope. CV: Denies chest pain, palpitations. Resp: Denies dyspnea, cough. GI: See HPI Heme: See HPI  Physical Exam: BP 138/72 (BP Location: Right Arm, Patient Position: Sitting, Cuff Size: Normal)   Pulse 65   Temp (!) 97.5 F (36.4 C) (Temporal)   Ht '5\' 1"'$  (1.549 m)   Wt 128 lb 12.8 oz (58.4 kg)   SpO2 98%   BMI 24.34 kg/m  General:   Alert and oriented. No distress noted. Pleasant and cooperative.  Head:  Normocephalic and atraumatic. Eyes:  Conjuctiva clear without scleral icterus. Mouth: Scattered white plaques noted on tongue consistent with oral candidiasis. Heart:  S1, S2 present without murmurs appreciated. Lungs:  Clear to auscultation bilaterally. No wheezes, rales, or rhonchi. No distress.  Abdomen:  +BS, soft, non-tender and non-distended. No rebound or  guarding. No HSM or masses noted. Msk:  Symmetrical without gross deformities. Normal posture. Extremities:  Without edema. Neurologic:  Alert and  oriented x4 Psych:  Normal mood and affect.    Assessment:  89 y.o. female with history of GERD, epigastric burning radiating to LUQ, constipation, "gas pockets" in the stomach, presenting today for routine follow-up.  Constipation/bloating: Fairly well-controlled with MiraLAX every other day.  Also taking Librax twice daily.  Continues with intermittent bloating, primarily in the morning, improved by passing gas or belching.  She does notice improvement on the days that she has a bowel movement which is usually every other day.  Recommended MiraLAX daily with a goal of having bowel movements every day.  Also advised to limit gas producing foods and try taking Gas-X at night to help with morning bloating.  GERD: Well-controlled on pantoprazole  40 mg twice daily.  Oral candidiasis: 4 mL nystatin swish and spit 4 times daily x 7 days. Recommended cleaning dentures daily and follow-up with PCP.     Plan:   MiraLAX 17 g in 8 ounces of water daily. May decrease to 1/2 capful daily if she develops diarrhea.  Simethicone prn. Recommended taking at bedtime daily to help with morning bloating.  Limit common gas producing items including broccoli, cabbage, cauliflower, Brussels sprouts, beans, artificial sweeteners, carbonated beverages, chewing gum, hard candy, drinking through a straw. Continue Librax BID.  Continue pantoprazole 40 mg BID.  4 ml Nystatin suspension  QID x 7 days.  Clean dentures daily. Follow-up with PCP on oral candidiasis.  Follow-up in our office in 3 months.    Aliene Altes, PA-C [facility] 11/14/2022

## 2022-11-14 ENCOUNTER — Encounter: Payer: Self-pay | Admitting: Gastroenterology

## 2022-11-14 ENCOUNTER — Ambulatory Visit (INDEPENDENT_AMBULATORY_CARE_PROVIDER_SITE_OTHER): Payer: Medicare Other | Admitting: Gastroenterology

## 2022-11-14 VITALS — BP 138/72 | HR 65 | Temp 97.5°F | Ht 61.0 in | Wt 128.8 lb

## 2022-11-14 DIAGNOSIS — K5900 Constipation, unspecified: Secondary | ICD-10-CM | POA: Diagnosis not present

## 2022-11-14 DIAGNOSIS — K9289 Other specified diseases of the digestive system: Secondary | ICD-10-CM | POA: Insufficient documentation

## 2022-11-14 DIAGNOSIS — K219 Gastro-esophageal reflux disease without esophagitis: Secondary | ICD-10-CM | POA: Diagnosis not present

## 2022-11-14 DIAGNOSIS — B370 Candidal stomatitis: Secondary | ICD-10-CM

## 2022-11-14 MED ORDER — NYSTATIN 100000 UNIT/ML MT SUSP: 4.0000 mL | Freq: Four times a day (QID) | OROMUCOSAL | 0 refills | Status: AC

## 2022-11-14 MED ORDER — CILIDINIUM-CHLORDIAZEPOXIDE 2.5-5 MG PO CAPS: 1.0000 | ORAL_CAPSULE | Freq: Two times a day (BID) | ORAL | 3 refills | Status: AC

## 2022-11-14 NOTE — Patient Instructions (Addendum)
To help with bowel regularity and bloating: Take 1 capful of MiraLAX in 8 ounces of water every day.  If you have diarrhea that is persistent, you can decrease to one half capful every day. Use simethicone (Gas-X) as needed.  You can try taking simethicone at bedtime to help prevent gassiness/bloating in the morning.  Limit common gas producing items such as broccoli, cabbage, cauliflower, Brussels sprouts, beans, artificial sweeteners, carbonated beverages, chewing gum, hard candy, drinking through a straw.  Continue taking pantoprazole 40 mg twice daily 30 minutes before breakfast and dinner.  For thrush/yeast infection in your mouth: Swish and spit 4 mL of nystatin solution 4 times daily for 7 days. Follow-up with your primary care provider on this.  We will follow-up with you in the office in about 3 months.  Do not hesitate to call sooner if you have questions or concerns.  It was nice to meet you today!  Aliene Altes, PA-C [facility]

## 2022-12-16 ENCOUNTER — Other Ambulatory Visit: Payer: Self-pay | Admitting: Gastroenterology

## 2022-12-16 NOTE — Telephone Encounter (Signed)
Refill of Nystatin is not appropriate at this time. This was a 1 time prescription for oral candidiasis (oral thrush). She should follow-up with her PCP if she feels she needs another prescription for oral thrush.

## 2022-12-18 ENCOUNTER — Other Ambulatory Visit: Payer: Self-pay | Admitting: Gastroenterology

## 2022-12-23 ENCOUNTER — Other Ambulatory Visit: Payer: Self-pay | Admitting: Gastroenterology

## 2022-12-24 NOTE — Telephone Encounter (Signed)
Toni Amend, can you call the pharmacy and have them discontinue this medication on their end or at least let them know we are not prescribing this medication. It was a 1 time prescription. If she needs additional refills, it will need to come from her PCP.

## 2023-02-23 ENCOUNTER — Ambulatory Visit: Payer: Medicare Other | Admitting: Gastroenterology

## 2023-02-23 ENCOUNTER — Encounter: Payer: Self-pay | Admitting: Gastroenterology

## 2023-03-02 ENCOUNTER — Encounter: Payer: Self-pay | Admitting: Gastroenterology

## 2023-03-02 ENCOUNTER — Ambulatory Visit (INDEPENDENT_AMBULATORY_CARE_PROVIDER_SITE_OTHER): Payer: Medicare Other | Admitting: Gastroenterology

## 2023-03-02 VITALS — BP 122/71 | HR 69 | Temp 97.6°F | Ht 61.0 in | Wt 130.6 lb

## 2023-03-02 DIAGNOSIS — K9289 Other specified diseases of the digestive system: Secondary | ICD-10-CM | POA: Diagnosis not present

## 2023-03-02 DIAGNOSIS — K5900 Constipation, unspecified: Secondary | ICD-10-CM

## 2023-03-02 DIAGNOSIS — K219 Gastro-esophageal reflux disease without esophagitis: Secondary | ICD-10-CM

## 2023-03-02 NOTE — Progress Notes (Signed)
GI Office Note    Referring Provider: Benita Stabile, MD Primary Care Physician:  Benita Stabile, MD Primary Gastroenterologist: Gerrit Friends.Rourk, MD  Date:  03/02/2023  ID:  Rosezella [place], DOB 10/08/1930, MRN [id number]   Chief Complaint   Chief Complaint  Patient presents with   Follow-up    Pt here to be seen for gas and bloating   History of Present Illness  DANEIL LIGHT is a 89 y.o. female with a history of GERD, epigastric burning radiating to LUQ previously improved with PPI twice daily, constipation, " gas pockets" in her stomach presenting today with complaint of gas and bloating.  EGD September 2023: -Distal esophageal nodule uncertain significance status post biopsy.  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  -Small hiatal hernia.  -Patchy gastric erythema status post biopsy; mild nonspecific reactive gastropathy, no H. pylori -normal duodenal bulb and second portion of the duodenum aside from single prominent lymphangitic appearing lesion in the second portion of the duodenum, biopsy c/w duodenal lymphangiectasia  Seen in our office in December 2023 with requiring gas pockets in her abdomen that move around.  She was taking Librax twice daily prescribed by provider in [place] many years prior and has been getting refills by Dr. Margo Aye.  Using Gas-X with improvement in symptoms.  She was recommended to take Librax 3-4 times daily as needed, continue PPI daily, and Gas-X as needed.  May try IBgard or similar over-the-counter agents continue peppermint oil and follow-up in 3 months.  Last office visit 11/14/2022.  Reported epigastric burning was much improved, with reflux we will controlled.  Continues to feel bloated intermittently in the left upper quadrant.  Eventually passes gas or belches and symptoms resolve.  It is more noticeable in the mornings.  Bowels moving fairly well, usually every Thursday  and takes MiraLAX every other day.  Bloating better when bowel movements are improved.  Not using Gas-X at the time but had been helpful in the past.  Continues taking Librax twice daily.  Librax not approved for more frequent dosing by insurance.  Reported a recent fungal infection to her mouth which was being treated with nystatin with working well.  Today:  Still having gas daily. Feels gas in the epigastrium and LUQ. It is not a sharp pain but it is there constantly. She states at times when it is lower it moves down. Takes miralax for bowel movements and her gas does improve with bowel movements. She sometime skips a day because daily can cause diarrhea.   Burping helps her with the upper abdominal discomfort. Taking Librax twice daily (insurance will not pay for higher dose) and pantoprazole twice daily. Does not take gas ex daily. Librax is workign well for abdominal pain.   No N/V, dysphagia, melena, brbrpr. After she takes pantoprazole she gets an appetite but without that she feels very full all the time.  Does drink milk but this sends her to the bathroom. Sometimes eats a bowl of cereal but not often. Denies other lactose other than with mac n cheese.   Does not eat collards, broccoli, beans. Tries to avoid these. Eats mashed potatoes, mac n cheese (out). Eats chicken and pig feet at times for protein intake. Eats a lot of grits, eggs, bacon for breakfast and she loves seafood therefore she eats a lot of shrimp. Tries to stay away from fried foods.    Current Outpatient Medications  Medication  Sig Dispense Refill   amLODipine-olmesartan (AZOR) 10-40 MG tablet Take 1 tablet by mouth daily.     aspirin EC 81 MG tablet Take 81 mg by mouth daily.     atorvastatin (LIPITOR) 10 MG tablet Take by mouth.     cholecalciferol (VITAMIN D3) 25 MCG (1000 UNIT) tablet Take 1,000 Units by mouth daily.     clidinium-chlordiazePOXIDE (LIBRAX) 5-2.5 MG capsule Take 1 capsule by mouth in the morning and  at bedtime. 180 capsule 3   metoprolol succinate (TOPROL-XL) 50 MG 24 hr tablet Take 50 mg by mouth daily.     pantoprazole (PROTONIX) 40 MG tablet Take 40 mg by mouth 2 (two) times daily.     polyethylene glycol (MIRALAX / GLYCOLAX) 17 g packet Take 17 g by mouth daily.     Simethicone 125 MG CAPS Take 125 mg by mouth every 6 (six) hours as needed for flatulence.     No current facility-administered medications for this visit.    Past Medical History:  Diagnosis Date   CKD (chronic kidney disease)    GERD (gastroesophageal reflux disease)    Heart murmur    HLD (hyperlipidemia)    HTN (hypertension)     Past Surgical History:  Procedure Laterality Date   ABDOMINAL HYSTERECTOMY     BIOPSY  06/04/2022   Procedure: BIOPSY;  Surgeon: Corbin Ade, MD;  [facility];  Service: Endoscopy;;   COLONOSCOPY     years ago with polyps per patient   ESOPHAGOGASTRODUODENOSCOPY (EGD) WITH PROPOFOL N/A 06/04/2022   Procedure: ESOPHAGOGASTRODUODENOSCOPY (EGD) WITH PROPOFOL;  Surgeon: Corbin Ade, MD;  [facility]: [facility];  Service: Endoscopy;  Laterality: N/A;  11:00 am    Family History  Problem Relation Age of Onset   Hypertension Mother    Heart failure Father     Allergies as of 03/02/2023   (No Known Allergies)    Social History   Socioeconomic History   Marital status: Married    Spouse name: Not on file   Number of children: 6   Years of education: Not on file   Highest education level: Not on file  Occupational History   Not on file  Tobacco Use   Smoking status: Never    Passive exposure: Never   Smokeless tobacco: Never  Vaping Use   Vaping Use: Never used  Substance and Sexual Activity   Alcohol use: Not Currently   Drug use: Never   Sexual activity: Not Currently  Other Topics Concern   Not on file  Social History Narrative   Not on file   Social Determinants of Health   Financial Resource Strain: Not on file  Food Insecurity: Not on  file  Transportation Needs: Not on file  Physical Activity: Not on file  Stress: Not on file  Social Connections: Not on file     Review of Systems   Gen: Denies fever, chills, anorexia. Denies fatigue, weakness, weight loss.  CV: Denies chest pain, palpitations, syncope, peripheral edema, and claudication. Resp: Denies dyspnea at rest, cough, wheezing, coughing up blood, and pleurisy. GI: See HPI Derm: Denies rash, itching, dry skin Psych: Denies depression, anxiety, memory loss, confusion. No homicidal or suicidal ideation.  Heme: Denies bruising, bleeding, and enlarged lymph nodes.  Physical Exam   BP 122/71   Pulse 69   Temp 97.6 F (36.4 C)   Ht 5\' 1"  (1.549 m)   Wt 130 lb 9.6 oz (59.2 kg)  BMI 24.68 kg/m   General:   Alert and oriented. No distress noted. Pleasant and cooperative.  Head:  Normocephalic and atraumatic. Eyes:  Conjuctiva clear without scleral icterus. Mouth:  Oral mucosa pink and moist. Good dentition. No lesions. Abdomen:  +BS, soft, non-tender and non-distended. No rebound or guarding. No HSM or masses noted. Rectal: deferred Msk:  Symmetrical without gross deformities. Normal posture. Extremities:  Without edema. Neurologic:  Alert and  oriented x4 Psych:  Alert and cooperative. Normal mood and affect.   Assessment  ARAVIS SERGEANT is a 89 y.o. female with a history of GERD, epigastric burning radiating to LUQ previously improved with PPI twice daily, constipation, " gas pockets" in her stomach presenting today with complaint of gas and bloating.  GERD: Well-controlled with pantoprazole 40 mg twice daily.  Constipation/Bloating: Constipation fairly well-controlled with MiraLAX using it every other day as daily use causes her diarrhea.  Most of her abdominal pain is controlled with Librax twice daily.  She continues to have intermittent daily bloating located in the epigastric region or the left upper quadrant/left lower quadrant.  Bloating is  relieved with burping and with bowel movements and has been trying to limit gas-producing foods.  Not currently taking any Gas-X, has been trying to manage with just her pantoprazole twice daily.  We will trial FDgard daily and if no improvement I have recommended for her to start Gas-X twice daily  PLAN   Continue pantoprazole 40 mg BID Trial Fdgard daily.  If no improvement with FDgard then take simethicone 125 mg twice daily scheduled.  Continue Miralax 1 capful (17g) daily or every other day Continue Librax twice daily Avoid, gas-producing foods, chewing gum, hard candy, and drinking through a straw. Follow up in 3 months    Brooke Bonito, MSN, FNP-BC, AGACNP-BC [facility]

## 2023-03-02 NOTE — Patient Instructions (Addendum)
Continue pantoprazole 40 mg twice daily.  Continue MiraLAX 17 g (1 capful) daily or every other day for constipation.  Continue Librax twice daily.  I am going to provide you with samples of FDgard to try.  Take 1 of these daily.  If you do not have any improvement of your bloating with this then I want you to start taking simethicone (Gas-X) twice daily.  Avoid gas-producing foods (eg, garlic, cabbage, legumes, onions, broccoli, brussel sprouts, wheat, and potatoes).  Also avoid drinking through a straw, chewing gum, and sucking on hard candy.   We will plan to follow-up in 3 months, sooner if needed.  It was a pleasure to see you today. I want to create trusting relationships with patients. If you receive a survey regarding your visit,  I greatly appreciate you taking time to fill this out on paper or through your MyChart. I value your feedback.  Brooke Bonito, MSN, FNP-BC, AGACNP-BC [facility]

## 2023-04-21 LAB — LAB REPORT - SCANNED
A1c: 5.7
Albumin, Urine POC: 25.4
Creatinine, POC: 77.3 mg/dL
EGFR: 48
Microalb Creat Ratio: 33

## 2023-04-23 DIAGNOSIS — E782 Mixed hyperlipidemia: Secondary | ICD-10-CM | POA: Diagnosis not present

## 2023-04-23 DIAGNOSIS — E559 Vitamin D deficiency, unspecified: Secondary | ICD-10-CM | POA: Diagnosis not present

## 2023-04-23 DIAGNOSIS — R7301 Impaired fasting glucose: Secondary | ICD-10-CM | POA: Diagnosis not present

## 2023-04-29 DIAGNOSIS — E870 Hyperosmolality and hypernatremia: Secondary | ICD-10-CM | POA: Diagnosis not present

## 2023-04-29 DIAGNOSIS — N1831 Chronic kidney disease, stage 3a: Secondary | ICD-10-CM | POA: Diagnosis not present

## 2023-04-29 DIAGNOSIS — K580 Irritable bowel syndrome with diarrhea: Secondary | ICD-10-CM | POA: Diagnosis not present

## 2023-04-29 DIAGNOSIS — I129 Hypertensive chronic kidney disease with stage 1 through stage 4 chronic kidney disease, or unspecified chronic kidney disease: Secondary | ICD-10-CM | POA: Diagnosis not present

## 2023-04-29 DIAGNOSIS — K219 Gastro-esophageal reflux disease without esophagitis: Secondary | ICD-10-CM | POA: Diagnosis not present

## 2023-04-29 DIAGNOSIS — R809 Proteinuria, unspecified: Secondary | ICD-10-CM | POA: Diagnosis not present

## 2023-04-29 DIAGNOSIS — E782 Mixed hyperlipidemia: Secondary | ICD-10-CM | POA: Diagnosis not present

## 2023-04-29 DIAGNOSIS — E559 Vitamin D deficiency, unspecified: Secondary | ICD-10-CM | POA: Diagnosis not present

## 2023-04-29 DIAGNOSIS — R011 Cardiac murmur, unspecified: Secondary | ICD-10-CM | POA: Diagnosis not present

## 2023-04-29 DIAGNOSIS — B370 Candidal stomatitis: Secondary | ICD-10-CM | POA: Diagnosis not present

## 2023-04-29 DIAGNOSIS — I10 Essential (primary) hypertension: Secondary | ICD-10-CM | POA: Diagnosis not present

## 2023-04-29 DIAGNOSIS — K5900 Constipation, unspecified: Secondary | ICD-10-CM | POA: Diagnosis not present

## 2023-05-06 ENCOUNTER — Encounter: Payer: Self-pay | Admitting: Gastroenterology

## 2023-05-19 DIAGNOSIS — Z0000 Encounter for general adult medical examination without abnormal findings: Secondary | ICD-10-CM | POA: Diagnosis not present

## 2023-05-29 NOTE — Progress Notes (Unsigned)
Referring Provider: Benita Stabile, MD Primary Care Physician:  Benita Stabile, MD Primary GI Physician: Dr. Jena Gauss  Chief Complaint  Patient presents with   Follow-up    Follow up. Having some burning in her stomach. Not having a complete bowel movement. Taking MiraLax daily does not seem to be helping only going every other day.     HPI:   Megan Mcguire is a 89 y.o. female with history of GERD, epigastric burning radiating to LUQ previously improved with PPI BID, constipation, "gas pockets" in stomach, chronically on Librax BID as insurance would not approve more frequent dosing, presenting today with chief complaint of upper abdominal burning and constipation.   Last seen in the office 03/02/2023.  Reported ongoing, chronic gas daily in the epigastric and LUQ region.  Some improvement with bowel movements taking MiraLAX to help with bowel regularity.  She was still taking Librax twice daily which was working well for her abdominal pain.  Also taking pantoprazole twice daily.  Said this allowed her to eat better as if she did not take it, she would feel full all the time.  Try to limit dairy products.  She was not eating collards, broccoli, beans.  She had a lot of grits, eggs, bacon, and shrimp.  She was advised to continue her current medications, start FD guard and if no improvement, start Gas-X twice daily.  Recommended 74-month follow-up.  Today: Having some epigastric/ LUQ burning. Has been going on since her last OV. Notes it is not a pain, but an annoyance. Improved by eating which is the same as it was prior to her EGD last year.   Belching and has a sour taste.  Has some early satiety.  No nausea or vomiting.  Weight is stable.   Still taking pantoprazole 40 mg BID.   Taking MiraLAX daily, but doesn't have BMs daily and isn't having complete bowel movements. Bowels moving every 2 days. No brbpr or melena. Mild LLQ abdominal pain that improves with a bowel movement.  Digestive  advantage helps some. Has been taking this for about 1 year.   No NSAIDs aside from 81 mg aspirin.    EGD September 2023: -Distal esophageal nodule uncertain significance status post biopsy.  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  -Small hiatal hernia.  -Patchy gastric erythema status post biopsy; mild nonspecific reactive gastropathy, no H. pylori -normal duodenal bulb and second portion of the duodenum aside from single prominent lymphangitic appearing lesion in the second portion of the duodenum, biopsy c/w duodenal lymphangiectasia  Past Medical History:  Diagnosis Date   CKD (chronic kidney disease)    GERD (gastroesophageal reflux disease)    Heart murmur    HLD (hyperlipidemia)    HTN (hypertension)     Past Surgical History:  Procedure Laterality Date   ABDOMINAL HYSTERECTOMY     BIOPSY  06/04/2022   Procedure: BIOPSY;  Surgeon: Corbin Ade, MD;  [facility];  Service: Endoscopy;;   COLONOSCOPY     years ago with polyps per patient   ESOPHAGOGASTRODUODENOSCOPY (EGD) WITH PROPOFOL N/A 06/04/2022   Procedure: ESOPHAGOGASTRODUODENOSCOPY (EGD) WITH PROPOFOL;  Surgeon: Corbin Ade, MD;  [facility];  Service: Endoscopy;  Laterality: N/A;  11:00 am    Current Outpatient Medications  Medication Sig Dispense Refill   amLODipine-olmesartan (AZOR) 10-40 MG tablet Take 1 tablet by mouth daily.     aspirin EC  81 MG tablet Take 81 mg by mouth daily.     atorvastatin (LIPITOR) 10 MG tablet Take by mouth.     cholecalciferol (VITAMIN D3) 25 MCG (1000 UNIT) tablet Take 1,000 Units by mouth daily.     clidinium-chlordiazePOXIDE (LIBRAX) 5-2.5 MG capsule Take 1 capsule by mouth in the morning and at bedtime. 180 capsule 3   esomeprazole (NEXIUM) 40 MG capsule Take 1 capsule (40 mg total) by mouth 2 (two) times daily before a meal. 60 capsule 3   metoprolol succinate  (TOPROL-XL) 50 MG 24 hr tablet Take 50 mg by mouth daily.     polyethylene glycol (MIRALAX / GLYCOLAX) 17 g packet Take 17 g by mouth daily.     Simethicone 125 MG CAPS Take 125 mg by mouth every 6 (six) hours as needed for flatulence.     No current facility-administered medications for this visit.    Allergies as of 06/01/2023   (No Known Allergies)    Family History  Problem Relation Age of Onset   Hypertension Mother    Heart failure Father     Social History   Socioeconomic History   Marital status: Married    Spouse name: Not on file   Number of children: 6   Years of education: Not on file   Highest education level: Not on file  Occupational History   Not on file  Tobacco Use   Smoking status: Never    Passive exposure: Never   Smokeless tobacco: Never  Vaping Use   Vaping status: Never Used  Substance and Sexual Activity   Alcohol use: Not Currently   Drug use: Never   Sexual activity: Not Currently  Other Topics Concern   Not on file  Social History Narrative   Not on file   Social Determinants of Health   Financial Resource Strain: Not on file  Food Insecurity: Not on file  Transportation Needs: Not on file  Physical Activity: Not on file  Stress: Not on file  Social Connections: Not on file    Review of Systems: Gen: Denies fever, chills, cold flulike symptoms, presyncope, syncope. CV: Denies chest pain, palpitations. Resp: Denies dyspnea, cough.  GI: See HPI Heme: See HPI  Physical Exam: BP 138/79 (BP Location: Right Arm, Patient Position: Sitting, Cuff Size: Normal)   Pulse (!) 58   Temp 97.7 F (36.5 C) (Temporal)   Ht 5\' 1"  (1.549 m)   Wt 129 lb 3.2 oz (58.6 kg)   SpO2 100%   BMI 24.41 kg/m  General:   Alert and oriented. No distress noted. Pleasant and cooperative.  Head:  Normocephalic and atraumatic. Eyes:  Conjuctiva clear without scleral icterus. Heart:  S1, S2 present without murmurs appreciated. Lungs:  Clear to  auscultation bilaterally. No wheezes, rales, or rhonchi. No distress.  Abdomen:  +BS, soft, non-tender and non-distended. No rebound or guarding. No HSM or masses noted. Msk:  Symmetrical without gross deformities. Normal posture. Extremities:  Without edema. Neurologic:  Alert and  oriented x4 Psych:  Normal mood and affect.    Assessment:  89 y.o. female with history of GERD, epigastric burning radiating to LUQ previously improved with PPI BID, constipation, "gas pockets" in stomach, chronically on Librax BID presenting today with chief complaint of upper abdominal burning and constipation.   Epigastric/LUQ burning:  Ongoing for the last 3+ months. Mild overall and improved by a meal. No alarm symptoms. Notably, very similar symptoms reported in August 2023 s/p EGD September 2023  which showed distal esophageal nodule with biopsies consistent with squamous papilloma, noncritical Schatzki's ring not manipulated, small hiatal hernia, gastric erythema with biopsies showing mild nonspecific reactive gastropathy, duodenal lymphangiectasia.  Back then, symptoms improved after changing omeprazole to pantoprazole twice daily.  Suspect we are dealing with gastritis/uncontrolled GERD. Query whether she is losing response to pantoprazole.  Will change pantoprazole to Nexium 40 mg twice daily and see how she does.  If persistent symptoms, will need to consider further evaluation.  GERD: Chronic. Not adequately managed on pantoprazole 40 mg BID. May be losing response to pantoprazole. Will try her on Nexium as she has previously been on omeprazole.   Constipation: Likely component of IBS-C.  Symptoms are not adequately managed with MiraLAX.  Recommend starting Linzess 72 mcg daily.  Samples provided.  Requested progress report in 1 week.   Plan:  Stop pantoprazole. Start Nexium 40 mg twice daily. Reinforced GERD diet/lifestyle.  Written instructions provided on AVS. Start Linzess 72 mcg daily 30  minutes before breakfast.  Samples provided.  Requested progress report in 1 week. Follow-up in 8 weeks or sooner if needed.  Ermalinda Memos, PA-C [facility] 06/01/2023

## 2023-06-01 ENCOUNTER — Ambulatory Visit (INDEPENDENT_AMBULATORY_CARE_PROVIDER_SITE_OTHER): Payer: Medicare Other | Admitting: Gastroenterology

## 2023-06-01 ENCOUNTER — Encounter: Payer: Self-pay | Admitting: Gastroenterology

## 2023-06-01 VITALS — BP 138/79 | HR 58 | Temp 97.7°F | Ht 61.0 in | Wt 129.2 lb

## 2023-06-01 DIAGNOSIS — K219 Gastro-esophageal reflux disease without esophagitis: Secondary | ICD-10-CM | POA: Diagnosis not present

## 2023-06-01 DIAGNOSIS — R1013 Epigastric pain: Secondary | ICD-10-CM

## 2023-06-01 DIAGNOSIS — K5900 Constipation, unspecified: Secondary | ICD-10-CM

## 2023-06-01 MED ORDER — ESOMEPRAZOLE MAGNESIUM 40 MG PO CPDR: 40.0000 mg | DELAYED_RELEASE_CAPSULE | Freq: Two times a day (BID) | ORAL | 3 refills | Status: DC

## 2023-06-01 NOTE — Patient Instructions (Addendum)
Stop pantoprazole.  Start esomeprazole (Nexium) 40 mg twice daily 30 minutes before breakfast and dinner.  Follow a GERD diet:  Avoid fried, fatty, greasy, spicy, citrus foods. Avoid caffeine and carbonated beverages. Avoid chocolate. Try eating 4-6 small meals a day rather than 3 large meals. Do not eat within 3 hours of laying down. Prop head of bed up on wood or bricks to create a 6 inch incline.  For constipation: Start Linzess 72 mcg daily.  Take this medication first thing in the morning, 30 minutes before breakfast.  As we discussed, this may cause diarrhea, but should taper off in 1-2 weeks.  We we will provide you with samples.  Please call in 1 week with a progress report.  If it works well, I will send in a prescription for you.   I will plan to see you back in 8 weeks or sooner if needed.  Ermalinda Memos, PA-C [facility]

## 2023-06-02 ENCOUNTER — Telehealth: Payer: Self-pay | Admitting: *Deleted

## 2023-06-02 ENCOUNTER — Other Ambulatory Visit: Payer: Self-pay | Admitting: Gastroenterology

## 2023-06-02 DIAGNOSIS — K219 Gastro-esophageal reflux disease without esophagitis: Secondary | ICD-10-CM

## 2023-06-02 DIAGNOSIS — R1013 Epigastric pain: Secondary | ICD-10-CM

## 2023-06-02 MED ORDER — ESOMEPRAZOLE MAGNESIUM 40 MG PO CPDR: 40.0000 mg | DELAYED_RELEASE_CAPSULE | Freq: Two times a day (BID) | ORAL | 3 refills | Status: DC

## 2023-06-02 NOTE — Telephone Encounter (Signed)
Rx sent 

## 2023-06-02 NOTE — Telephone Encounter (Signed)
Can you send pt's Nexium to pharmacy. It was sent to the wrong one. The right pharmacy is in the chart now.

## 2023-06-03 ENCOUNTER — Ambulatory Visit: Payer: Medicare Other | Admitting: Gastroenterology

## 2023-06-29 ENCOUNTER — Other Ambulatory Visit: Payer: Self-pay | Admitting: Gastroenterology

## 2023-06-29 ENCOUNTER — Telehealth: Payer: Self-pay | Admitting: *Deleted

## 2023-06-29 DIAGNOSIS — K5904 Chronic idiopathic constipation: Secondary | ICD-10-CM

## 2023-06-29 MED ORDER — LINACLOTIDE 72 MCG PO CAPS
72.0000 ug | ORAL_CAPSULE | Freq: Every day | ORAL | 5 refills | Status: DC
Start: 2023-06-29 — End: 2024-04-07

## 2023-06-29 NOTE — Telephone Encounter (Signed)
Pt called and states she would like a prescriptions for Linzess sent to [facility].

## 2023-06-29 NOTE — Telephone Encounter (Signed)
Noted  

## 2023-06-29 NOTE — Telephone Encounter (Signed)
Rx sent 

## 2023-07-08 ENCOUNTER — Ambulatory Visit
Admission: EM | Admit: 2023-07-08 | Discharge: 2023-07-08 | Disposition: A | Discharge status: Home / Self Care | Payer: Medicare Other

## 2023-07-08 ENCOUNTER — Encounter: Payer: Self-pay | Admitting: Emergency Medicine

## 2023-07-08 DIAGNOSIS — M545 Low back pain, unspecified: Secondary | ICD-10-CM
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
## 2023-07-08 NOTE — Discharge Instructions (Signed)
Your exam is very reassuring today, you are likely to feel more sore and stiff tomorrow so make sure to be taking Tylenol, using heating pads and warm Epsom salt baths, muscle rubs, massage and taking it easy.  If anything frequently worsens follow-up for reevaluation

## 2023-07-27 ENCOUNTER — Ambulatory Visit (INDEPENDENT_AMBULATORY_CARE_PROVIDER_SITE_OTHER): Payer: Medicare Other | Admitting: Gastroenterology

## 2023-07-27 ENCOUNTER — Encounter: Payer: Self-pay | Admitting: Gastroenterology

## 2023-07-27 VITALS — BP 139/73 | HR 64 | Temp 97.7°F | Ht 61.0 in | Wt 131.0 lb

## 2023-07-27 DIAGNOSIS — R1012 Left upper quadrant pain: Secondary | ICD-10-CM

## 2023-07-27 DIAGNOSIS — K5909 Other constipation: Secondary | ICD-10-CM

## 2023-07-27 DIAGNOSIS — R1013 Epigastric pain: Secondary | ICD-10-CM

## 2023-07-27 DIAGNOSIS — Z23 Encounter for immunization: Secondary | ICD-10-CM | POA: Diagnosis not present

## 2023-07-27 DIAGNOSIS — K219 Gastro-esophageal reflux disease without esophagitis: Secondary | ICD-10-CM

## 2023-07-27 DIAGNOSIS — K5904 Chronic idiopathic constipation: Secondary | ICD-10-CM

## 2023-07-27 MED ORDER — ATORVASTATIN CALCIUM 10 MG PO TABS: 10.0000 mg | ORAL_TABLET | Freq: Every day | ORAL | 0 refills | Status: AC

## 2023-07-27 MED ORDER — ESOMEPRAZOLE MAGNESIUM 40 MG PO CPDR: 40.0000 mg | DELAYED_RELEASE_CAPSULE | Freq: Two times a day (BID) | ORAL | 3 refills | Status: AC

## 2023-07-27 NOTE — Patient Instructions (Addendum)
Continue Nexium 40 mg twice daily.  Continue MiraLAX 17 g once daily.  You can increase to twice a day if you need to if you are feeling more constipated.  If that does not work please let me know and we can consider other prescription laxatives that may be covered better by her insurance.  Follow a GERD diet:  Avoid fried, fatty, greasy, spicy, citrus foods. Avoid caffeine and carbonated beverages. Avoid chocolate. Try eating 4-6 small meals a day rather than 3 large meals. Do not eat within 3 hours of laying down. Prop head of bed up on wood or bricks to create a 6 inch incline.  Follow-up in 6 months, sooner if needed.  Happy holidays!  It was a pleasure to see you today. I want to create trusting relationships with patients. If you receive a survey regarding your visit,  I greatly appreciate you taking time to fill this out on paper or through your MyChart. I value your feedback.  Brooke Bonito, MSN, FNP-BC, AGACNP-BC [facility]

## 2023-07-27 NOTE — Progress Notes (Signed)
GI Office Note    Referring Provider: Benita Stabile, MD Primary Care Physician:  Benita Stabile, MD Primary Gastroenterologist: Gerrit Friends.Rourk, MD  Date:  07/27/2023  ID:  RUSTY MANSER, DOB May 20, 1931, MRN [id number]   Chief Complaint   Chief Complaint  Patient presents with   Follow-up    Follow up. Still having some gas     History of Present Illness  Megan Mcguire is a 89 y.o. female with a history of chronic GERD and epigastric pain/LUQ pain with slight improvement of PPI twice daily, constipation, and chronically on Librax twice daily presenting today with complaint of gassiness.  EGD September 2023: -Distal esophageal nodule uncertain significance status post biopsy.  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  -Small hiatal hernia.  -Patchy gastric erythema status post biopsy; mild nonspecific reactive gastropathy, no H. pylori -normal duodenal bulb and second portion of the duodenum aside from single prominent lymphangitic appearing lesion in the second portion of the duodenum, biopsy c/w duodenal lymphangiectasia  Last office visit 06/01/2023.  Patient reported having ongoing epigastric and left upper quadrant burning since her last office visit.  Denies any actual pain but states it is an annoyance.  Improved by eating which she admitted to was the same as since her prior EGD.  Belching with a sour taste and some early satiety but denied any actual nausea or vomiting.  Taking her pantoprazole 40 mg twice a day.  Taking MiraLAX daily but not having bowel movements daily and having incomplete emptying.  Mild left lower quadrant pain that improves with bowel movement.  Taking digestive advantage which helps some, taking for about a year.  Advised to stop pantoprazole and start Nexium 40 mg twice a day, reinforced GERD diet.  Advised to start Linzess daily and provided samples.  Patient requested  prescription for Linzess 72 mcg daily earlier this month.  Today:  Constipation - unable to afford Linzess and went back to tacking miralax once daily. Just had a BM today. Having a BM every other day. No abdominal pain and feeling like she is emptying better. No blood in her stool. Sometimes eating cereal in the morning. Has tried prune juice and that works for her sometimes.   Taking librax once daily.   GERD - gassiness Is better. Has a decent appetite and no changes. Has been doing much better since the Nexium.   Wt Readings from Last 3 Encounters:  07/27/23 131 lb (59.4 kg)  06/01/23 129 lb 3.2 oz (58.6 kg)  03/02/23 130 lb 9.6 oz (59.2 kg)    Current Outpatient Medications  Medication Sig Dispense Refill   amLODipine-olmesartan (AZOR) 10-40 MG tablet Take 1 tablet by mouth daily.     aspirin EC 81 MG tablet Take 81 mg by mouth daily.     atorvastatin (LIPITOR) 10 MG tablet Take by mouth.     cholecalciferol (VITAMIN D3) 25 MCG (1000 UNIT) tablet Take 1,000 Units by mouth daily.     clidinium-chlordiazePOXIDE (LIBRAX) 5-2.5 MG capsule Take 1 capsule by mouth in the morning and at bedtime. 180 capsule 3   esomeprazole (NEXIUM) 40 MG capsule Take 1 capsule (40 mg total) by mouth 2 (two) times daily before a meal. 60 capsule 3   linaclotide (LINZESS) 72 MCG capsule Take 1 capsule (72 mcg total) by mouth daily before breakfast. 30 capsule 5   metoprolol succinate (TOPROL-XL) 50 MG 24 hr tablet Take 50 mg  by mouth daily.     polyethylene glycol (MIRALAX / GLYCOLAX) 17 g packet Take 17 g by mouth daily.     Simethicone 125 MG CAPS Take 125 mg by mouth every 6 (six) hours as needed for flatulence.     No current facility-administered medications for this visit.    Past Medical History:  Diagnosis Date   CKD (chronic kidney disease)    GERD (gastroesophageal reflux disease)    Heart murmur    HLD (hyperlipidemia)    HTN (hypertension)     Past Surgical History:  Procedure  Laterality Date   ABDOMINAL HYSTERECTOMY     BIOPSY  06/04/2022   Procedure: BIOPSY;  Surgeon: Corbin Ade, MD;  [facility];  Service: Endoscopy;;   COLONOSCOPY     years ago with polyps per patient   ESOPHAGOGASTRODUODENOSCOPY (EGD) WITH PROPOFOL N/A 06/04/2022   Procedure: ESOPHAGOGASTRODUODENOSCOPY (EGD) WITH PROPOFOL;  Surgeon: Corbin Ade, MD;  [facility];  Service: Endoscopy;  Laterality: N/A;  11:00 am    Family History  Problem Relation Age of Onset   Hypertension Mother    Heart failure Father     Allergies as of 07/27/2023   (No Known Allergies)    Social History   Socioeconomic History   Marital status: Married    Spouse name: Not on file   Number of children: 6   Years of education: Not on file   Highest education level: Not on file  Occupational History   Not on file  Tobacco Use   Smoking status: Never    Passive exposure: Never   Smokeless tobacco: Never  Vaping Use   Vaping status: Never Used  Substance and Sexual Activity   Alcohol use: Not Currently   Drug use: Never   Sexual activity: Not Currently  Other Topics Concern   Not on file  Social History Narrative   Not on file   Social Determinants of Health   Financial Resource Strain: Not on file  Food Insecurity: Not on file  Transportation Needs: Not on file  Physical Activity: Not on file  Stress: Not on file  Social Connections: Not on file     Review of Systems   Gen: Denies fever, chills, anorexia. Denies fatigue, weakness, weight loss.  CV: Denies chest pain, palpitations, syncope, peripheral edema, and claudication. Resp: Denies dyspnea at rest, cough, wheezing, coughing up blood, and pleurisy. GI: See HPI Derm: Denies rash, itching, dry skin Psych: Denies depression, anxiety, memory loss, confusion. No homicidal or suicidal ideation.  Heme: Denies bruising, bleeding, and enlarged lymph nodes.  Physical Exam   BP 139/73 (BP Location: Right  Arm, Patient Position: Sitting, Cuff Size: Normal)   Pulse 64   Temp 97.7 F (36.5 C) (Temporal)   Ht 5\' 1"  (1.549 m)   Wt 131 lb (59.4 kg)   BMI 24.75 kg/m   General:   Alert and oriented. Thin skin. No distress noted. Pleasant and cooperative.  Head:  Normocephalic and atraumatic. Eyes:  Conjuctiva clear without scleral icterus. Abdomen:  +BS, soft, non-tender and non-distended. No rebound or guarding. No HSM or masses noted. Rectal: deferred Msk:  Symmetrical without gross deformities. Normal posture. Extremities:  Without edema. Neurologic:  Alert and  oriented x4 Psych:  Alert and cooperative. Normal mood and affect.  Assessment  Megan Mcguire is a 89 y.o. female with a history of chronic GERD and epigastric pain/LUQ pain with slight improvement of PPI twice daily,  constipation, and chronically on Librax twice daily presenting today for follow-up.  GERD, LUQ/epigastric pain and burning: Previously complaining of left upper quadrant epigastric burning that was very annoying.  Had been maintained on pantoprazole 40 mg twice daily for a while.  After last visit she was switched to Nexium 40 mg twice daily and has had complete resolution of all of the symptoms.  Her typical reflux symptoms are also very well-controlled.  Will continue Nexium BID, refilled today.  Chronic constipation: Using MiraLAX daily and not complaining of any incomplete emptying or any lower abdominal pain.  Also not as much gassiness either.  Had tried to get Linzess covered by her insurance but states she was unable to afford it so she had started back taking MiraLAX.  We discussed potentially increasing this to twice a day if her constipation began to worsen, although currently appears to be well-controlled.  PLAN   Continue Nexium 40 mg BID Continue miralax daily, can increase to twice daily if needed GERD diet Could try trulance or amitiza in future if constipation not well controlled.  Follow-up in 6  months    Brooke Bonito, MSN, FNP-BC, AGACNP-BC [facility]

## 2023-10-23 DIAGNOSIS — R7301 Impaired fasting glucose: Secondary | ICD-10-CM | POA: Diagnosis not present

## 2023-10-23 DIAGNOSIS — E559 Vitamin D deficiency, unspecified: Secondary | ICD-10-CM | POA: Diagnosis not present

## 2023-10-23 DIAGNOSIS — E782 Mixed hyperlipidemia: Secondary | ICD-10-CM | POA: Diagnosis not present

## 2023-10-26 ENCOUNTER — Other Ambulatory Visit: Payer: Self-pay | Admitting: Gastroenterology

## 2023-10-28 ENCOUNTER — Other Ambulatory Visit: Payer: Self-pay | Admitting: Gastroenterology

## 2023-10-30 DIAGNOSIS — E559 Vitamin D deficiency, unspecified: Secondary | ICD-10-CM | POA: Diagnosis not present

## 2023-10-30 DIAGNOSIS — R809 Proteinuria, unspecified: Secondary | ICD-10-CM | POA: Diagnosis not present

## 2023-10-30 DIAGNOSIS — E870 Hyperosmolality and hypernatremia: Secondary | ICD-10-CM | POA: Diagnosis not present

## 2023-10-30 DIAGNOSIS — N1831 Chronic kidney disease, stage 3a: Secondary | ICD-10-CM | POA: Diagnosis not present

## 2023-10-30 DIAGNOSIS — I10 Essential (primary) hypertension: Secondary | ICD-10-CM | POA: Diagnosis not present

## 2023-10-30 DIAGNOSIS — K580 Irritable bowel syndrome with diarrhea: Secondary | ICD-10-CM | POA: Diagnosis not present

## 2023-10-30 DIAGNOSIS — I129 Hypertensive chronic kidney disease with stage 1 through stage 4 chronic kidney disease, or unspecified chronic kidney disease: Secondary | ICD-10-CM | POA: Diagnosis not present

## 2023-10-30 DIAGNOSIS — K219 Gastro-esophageal reflux disease without esophagitis: Secondary | ICD-10-CM | POA: Diagnosis not present

## 2023-10-30 DIAGNOSIS — R011 Cardiac murmur, unspecified: Secondary | ICD-10-CM | POA: Diagnosis not present

## 2023-10-30 DIAGNOSIS — K5900 Constipation, unspecified: Secondary | ICD-10-CM | POA: Diagnosis not present

## 2023-10-30 DIAGNOSIS — E782 Mixed hyperlipidemia: Secondary | ICD-10-CM | POA: Diagnosis not present

## 2023-10-30 DIAGNOSIS — R7301 Impaired fasting glucose: Secondary | ICD-10-CM | POA: Diagnosis not present

## 2023-11-19 NOTE — Therapy (Deleted)
 OUTPATIENT PHYSICAL THERAPY THORACOLUMBAR EVALUATION   Patient Name: Megan Mcguire MRN: [id number] DOB:21-Nov-1930, 89 y.o., female Today's Date: 11/19/2023  END OF SESSION:   Past Medical History:  Diagnosis Date   CKD (chronic kidney disease)    GERD (gastroesophageal reflux disease)    Heart murmur    HLD (hyperlipidemia)    HTN (hypertension)    Past Surgical History:  Procedure Laterality Date   ABDOMINAL HYSTERECTOMY     BIOPSY  06/04/2022   Procedure: BIOPSY;  Surgeon: Corbin Ade, MD;  [facility];  Service: Endoscopy;;   COLONOSCOPY     years ago with polyps per patient   ESOPHAGOGASTRODUODENOSCOPY (EGD) WITH PROPOFOL N/A 06/04/2022   Procedure: ESOPHAGOGASTRODUODENOSCOPY (EGD) WITH PROPOFOL;  Surgeon: Corbin Ade, MD;  [facility];  Service: Endoscopy;  Laterality: N/A;  11:00 am   Patient Active Problem List   Diagnosis Date Noted   Oral candidiasis 11/14/2022   Gas bloat syndrome 11/14/2022   LUQ pain 07/16/2022   Constipation 04/28/2022   Epigastric burning sensation 04/28/2022   SYSTOLIC MURMUR 09/26/2008   Osteoporosis 12/22/2006   HYPERLIPIDEMIA 08/19/2006   Essential hypertension 08/19/2006   Gastroesophageal reflux disease 08/19/2006   IBS 08/19/2006   History of colonic polyps 08/19/2006    PCP: Benita Stabile, MD  REFERRING PROVIDER: Benita Stabile, MD  REFERRING DIAG:  low back pain    Rationale for Evaluation and Treatment: Rehabilitation  THERAPY DIAG:  Lumbar radiculopathy Muscle weakness   ONSET DATE: ***                                                                                                                                                                                          SUBJECTIVE STATEMENT: ***  PERTINENT HISTORY:  osteoporosis  PAIN:  Are you having pain? Yes: NPRS scale: *** Pain location: *** Pain description: *** Aggravating factors: *** Relieving factors: ***  PRECAUTIONS:  None  RED FLAGS: None   WEIGHT BEARING RESTRICTIONS: No  FALLS:  Has patient fallen in last 6 months? No  LIVING ENVIRONMENT: Lives with: lives with their family Lives in: House/apartment Stairs: {opstairs:27293} Has following equipment at home: {Assistive devices:23999}  OCCUPATION: retired   PLOF: Independent with basic ADLs  PATIENT GOALS:   NEXT MD VISIT: ***  OBJECTIVE:  Note: Objective measures were completed at Evaluation unless otherwise noted.  DIAGNOSTIC FINDINGS:  ***  PATIENT SURVEYS:  {rehab surveys:24030}  COGNITION: Overall cognitive status: {cognition:24006}     SENSATION: {sensation:27233}  MUSCLE LENGTH: Hamstrings: Right *** deg; Left *** deg Maisie Fus test: Right *** deg; Left *** deg  POSTURE: {posture:25561}  PALPATION: ***  LUMBAR ROM:   AROM eval  Flexion   Extension   Right lateral flexion   Left lateral flexion   Right rotation   Left rotation    (Blank rows = not tested)  LOWER EXTREMITY ROM:     {AROM/PROM:27142}  Right eval Left eval  Hip flexion    Hip extension    Hip abduction    Hip adduction    Hip internal rotation    Hip external rotation    Knee flexion    Knee extension    Ankle dorsiflexion    Ankle plantarflexion    Ankle inversion    Ankle eversion     (Blank rows = not tested)  LOWER EXTREMITY MMT:    MMT Right eval Left eval  Hip flexion    Hip extension    Hip abduction    Hip adduction    Hip internal rotation    Hip external rotation    Knee flexion    Knee extension    Ankle dorsiflexion    Ankle plantarflexion    Ankle inversion    Ankle eversion     (Blank rows = not tested)  LUMBAR SPECIAL TESTS:  {lumbar special test:25242}  FUNCTIONAL TESTS:  {Functional tests:24029}  GAIT: Distance walked: *** Assistive device utilized: {Assistive devices:23999} Level of assistance: {Levels of assistance:24026} Comments: ***  TREATMENT DATE: ***                                                                                                                                  PATIENT EDUCATION:  Education details: *** Person educated: {Person educated:25204} Education method: {Education Method:25205} Education comprehension: {Education Comprehension:25206}  HOME EXERCISE PROGRAM: ***  ASSESSMENT:  CLINICAL IMPRESSION: Patient is a *** y.o. *** who was seen today for physical therapy evaluation and treatment for ***.   OBJECTIVE IMPAIRMENTS: {opptimpairments:25111}.   ACTIVITY LIMITATIONS: {activitylimitations:27494}  PARTICIPATION LIMITATIONS: {participationrestrictions:25113}  PERSONAL FACTORS: {Personal factors:25162} are also affecting patient's functional outcome.   REHAB POTENTIAL: {rehabpotential:25112}  CLINICAL DECISION MAKING: {clinical decision making:25114}  EVALUATION COMPLEXITY: {Evaluation complexity:25115}   GOALS: Goals reviewed with patient? {yes/no:20286}  SHORT TERM GOALS: Target date: ***  *** Baseline: Goal status: INITIAL  2.  *** Baseline:  Goal status: INITIAL  3.  *** Baseline:  Goal status: INITIAL  4.  *** Baseline:  Goal status: INITIAL  5.  *** Baseline:  Goal status: INITIAL  6.  *** Baseline:  Goal status: INITIAL  LONG TERM GOALS: Target date: ***  *** Baseline:  Goal status: INITIAL  2.  *** Baseline:  Goal status: INITIAL  3.  *** Baseline:  Goal status: INITIAL  4.  *** Baseline:  Goal status: INITIAL  5.  *** Baseline:  Goal status: INITIAL  6.  *** Baseline:  Goal status: INITIAL  PLAN:  PT FREQUENCY: {rehab frequency:25116}  PT DURATION: {rehab duration:25117}  PLANNED INTERVENTIONS: {  rehab planned interventions:25118::"97110-Therapeutic exercises","97530- Therapeutic [phone]- Neuromuscular re-education","97535- Self JXBJ","47829- Manual therapy"}.  PLAN FOR NEXT SESSION: Virgina Organ, PT CLT [phone]  11/19/2023, 3:32 PM

## 2023-11-23 ENCOUNTER — Ambulatory Visit (HOSPITAL_COMMUNITY): Payer: Medicare Other | Admitting: Physical Therapy

## 2023-11-23 ENCOUNTER — Telehealth (HOSPITAL_COMMUNITY): Payer: Self-pay | Admitting: Physical Therapy

## 2023-11-23 NOTE — Telephone Encounter (Signed)
 Called PT re no show.  Pt states she tried to call and cancel but she could not get through.  PT request scheduler's to call her and reschedule.  Virgina Organ, PT CLT [phone]

## 2023-12-23 ENCOUNTER — Other Ambulatory Visit: Payer: Self-pay

## 2023-12-23 ENCOUNTER — Encounter (HOSPITAL_COMMUNITY): Payer: Self-pay

## 2023-12-23 ENCOUNTER — Ambulatory Visit (HOSPITAL_COMMUNITY): Attending: Internal Medicine

## 2023-12-23 DIAGNOSIS — M5459 Other low back pain: Secondary | ICD-10-CM | POA: Insufficient documentation

## 2023-12-23 DIAGNOSIS — M5386 Other specified dorsopathies, lumbar region: Secondary | ICD-10-CM | POA: Diagnosis not present

## 2023-12-23 DIAGNOSIS — Z7409 Other reduced mobility: Secondary | ICD-10-CM | POA: Diagnosis not present

## 2023-12-23 DIAGNOSIS — M6281 Muscle weakness (generalized): Secondary | ICD-10-CM | POA: Insufficient documentation

## 2023-12-23 NOTE — Therapy (Signed)
 OUTPATIENT PHYSICAL THERAPY THORACOLUMBAR EVALUATION   Patient Name: Megan Mcguire MRN: [id number] DOB:[birth year], 89 y.o., female Today's Date: 12/23/2023  END OF SESSION:  PT End of Session - 12/23/23 1515     Visit Number 1    Number of Visits 9    Date for PT Re-Evaluation 01/20/24    Authorization Type Medicare part A and B    Authorization Time Period No auth, No limit    PT Start Time 1515    PT Stop Time 1559    PT Time Calculation (min) 44 min    Activity Tolerance Patient tolerated treatment well    Behavior During Therapy WFL for tasks assessed/performed             Past Medical History:  Diagnosis Date   CKD (chronic kidney disease)    GERD (gastroesophageal reflux disease)    Heart murmur    HLD (hyperlipidemia)    HTN (hypertension)    Past Surgical History:  Procedure Laterality Date   ABDOMINAL HYSTERECTOMY     BIOPSY  06/04/2022   Procedure: BIOPSY;  Surgeon: Corbin Ade, MD;  [facility];  Service: Endoscopy;;   COLONOSCOPY     years ago with polyps per patient   ESOPHAGOGASTRODUODENOSCOPY (EGD) WITH PROPOFOL N/A 06/04/2022   Procedure: ESOPHAGOGASTRODUODENOSCOPY (EGD) WITH PROPOFOL;  Surgeon: Corbin Ade, MD;  [facility];  Service: Endoscopy;  Laterality: N/A;  11:00 am   Patient Active Problem List   Diagnosis Date Noted   Oral candidiasis 11/14/2022   Gas bloat syndrome 11/14/2022   LUQ pain 07/16/2022   Constipation 04/28/2022   Epigastric burning sensation 04/28/2022   SYSTOLIC MURMUR 09/26/2008   Osteoporosis 12/22/2006   HYPERLIPIDEMIA 08/19/2006   Essential hypertension 08/19/2006   Gastroesophageal reflux disease 08/19/2006   IBS 08/19/2006   History of colonic polyps 08/19/2006    PCP: Benita Stabile, MD  REFERRING PROVIDER: Benita Stabile, MD  REFERRING DIAG: low back pain  Rationale for Evaluation and Treatment: Rehabilitation  THERAPY DIAG:  Other low back pain  Impaired functional  mobility, balance, gait, and endurance  Decreased ROM of lumbar spine  Muscle weakness (generalized)  ONSET DATE: About 6 months  SUBJECTIVE:                                                                                                                                                                                           SUBJECTIVE STATEMENT: Patient reports low back pain that is Not a constant pain. Whenever she stands after sitting for long periods it hurts initially but after walking a little  it goes away. Describes pain as a catch. Sometimes travel to R hip, and that also feels like a catch. Feels pain when bending and Lifting from ground.   PERTINENT HISTORY:  GERD  PAIN:  Are you having pain? No  PRECAUTIONS: None  RED FLAGS: Bowel or bladder incontinence: No   WEIGHT BEARING RESTRICTIONS: No  FALLS:  Has patient fallen in last 6 months? 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   PATIENT SURVEYS:  Modified Oswestry Low Back Pain Disability Questionnaire: 11 / 50 = 22.0 %  COGNITION: Overall cognitive status: Within functional limits for tasks assessed     SENSATION: WFL  MUSCLE LENGTH: Hamstrings: Right ~130 deg; Left ~130 deg   POSTURE: rounded shoulders and forward head  PALPATION: TTP t/o lumbar all vertebrae and paraspinals. Muscle spasms occurring with grade 1-2 mobs. Inc pain with pressure on R hip when in side-lying.   LUMBAR ROM:   AROM eval  Flexion WFL  Extension 50%, pain on L side Low back   Right lateral flexion 25%, inc difficulty  Left lateral flexion 50%  Right rotation WFL  Left rotation WFL   (Blank rows = not tested)  LOWER EXTREMITY ROM:     Active  Right eval Left eval   Hip flexion    Hip extension    Hip abduction    Hip adduction    Hip internal rotation    Hip external rotation    Knee flexion    Knee extension    Ankle dorsiflexion    Ankle plantarflexion    Ankle inversion    Ankle eversion     (Blank rows = not tested)  LOWER EXTREMITY MMT:    MMT Right eval Left eval  Hip flexion    Hip extension 3+ 3+  Hip abduction 4- 4-  Hip adduction    Hip internal rotation    Hip external rotation    Knee flexion    Knee extension    Ankle dorsiflexion 5 5  Ankle plantarflexion    Ankle inversion    Ankle eversion     (Blank rows = not tested)  LUMBAR SPECIAL TESTS:    FUNCTIONAL TESTS:  30 seconds chair stand test 2 minute walk test:   30 sec chair rise: 7.5 LB feels a little sore after, pt with one stumble in posterior direction  : 168', dec   GAIT: Distance walked: 168' Assistive device utilized: None Level of assistance: Complete Independence Comments: Dec velocity, dec step lengths bilat, no LOB, dec hip ext bilat  TREATMENT DATE:  12/23/23: PT Eval and HEP  PATIENT EDUCATION:  Education details: PT evaluation, objective findings, POC, Importance of HEP, Precautions, Clinic policies  Person educated: Patient Education method: Explanation and Demonstration Education comprehension: verbalized understanding and returned demonstration  HOME EXERCISE PROGRAM: Access Code: [email or web address] URL: [email or web address] Date: 12/23/2023 Prepared by: Fabiola Backer Powell-Butler  Exercises - Supine Lower Trunk Rotation  - 1 x daily - 7 x weekly - 3 sets - 10 reps - Hooklying Hamstring Stretch with Strap  - 1 x daily - 7 x weekly - 3 sets - 10 reps - Hooklying Single Knee to Chest Stretch  - 1 x daily - 7 x weekly - 3 sets - 10 reps  ASSESSMENT:  CLINICAL IMPRESSION: Patient is a 89 y.o. female  who was seen today for physical therapy evaluation and treatment for low back pain. Patient demonstrates decreased ROM in lumbar spine, tight hamstring musculature, altered gait mechanics, and decreased LE strength which all may be contributing to her increased pain, specifically with transitional movements and limiting her performance of functional tasks such as bending, lifting, ambulation, and prolonged sitting/standing. Patient will benefit from skilled physical therapy in order to address the above to improve overall function and QOL.   OBJECTIVE IMPAIRMENTS: Abnormal gait, decreased activity tolerance, decreased balance, decreased endurance, decreased mobility, decreased strength, impaired flexibility, and pain.   ACTIVITY LIMITATIONS: bending, sitting, standing, squatting, stairs, and transfers  PARTICIPATION LIMITATIONS: cleaning, laundry, occupation, and yard work  [facility] POTENTIAL: Fair    CLINICAL DECISION MAKING: Stable/uncomplicated  EVALUATION COMPLEXITY: Low   GOALS: Goals reviewed with patient? No  SHORT TERM GOALS: Target date: 01/06/24  Patient will be independent with performance of HEP to demonstrate adequate self management of symptoms.  Baseline:  Goal status: INITIAL  2.   Patient will report at least a 25% improvement with function or pain overall since beginning PT. Baseline:  Goal status: INITIAL   LONG TERM GOALS: Target date: 01/20/24  Patient will improve Oswestry score by 6 points in order to improve self-perceived disability and overall function.  Baseline: See above Goal status: INITIAL  2.  Patient will improve 30 sec chair rise score to at least 12 STS in order to display improve LE strength and power for functional tasks  .  Baseline: See above Goal status: INITIAL  3.  Patient will improve test by at least 57ft  in order to demonstrate improved LE and overall endurance. Baseline: See above Goal status: INITIAL  4.  Patient will report  overall 50% improvement since beginning PT. Baseline:  Goal status: INITIAL   PLAN:  PT FREQUENCY: 2x/week  PT DURATION: 4 weeks  PLANNED INTERVENTIONS: 97164- PT Re-evaluation, 97110-Therapeutic exercises, 97530- Therapeutic activity, [id number]- Neuromuscular re-education, 97535- Self Care, 21308- Manual therapy, [phone]- Gait training, Patient/Family education, Joint mobilization, Spinal mobilization, and Moist heat.  PLAN FOR NEXT SESSION: Progress low back mobility, Manual STM to paraspinals, balance, LE/hip strengthening, Core strengthening   6:23 PM, 12/23/23 Itzy Adler Powell-Butler, PT, DPT [facility] [place]

## 2024-01-13 ENCOUNTER — Ambulatory Visit (HOSPITAL_COMMUNITY)

## 2024-01-13 DIAGNOSIS — Z7409 Other reduced mobility: Secondary | ICD-10-CM

## 2024-01-13 DIAGNOSIS — M6281 Muscle weakness (generalized): Secondary | ICD-10-CM | POA: Diagnosis not present

## 2024-01-13 DIAGNOSIS — M5459 Other low back pain: Secondary | ICD-10-CM

## 2024-01-13 DIAGNOSIS — M5386 Other specified dorsopathies, lumbar region: Secondary | ICD-10-CM | POA: Diagnosis not present

## 2024-01-13 NOTE — Therapy (Signed)
 OUTPATIENT PHYSICAL THERAPY THORACOLUMBAR EVALUATION   Patient Name: Megan Mcguire MRN: [id number] DOB:[birth year], 89 y.o., female Today's Date: 01/13/2024  END OF SESSION:  PT End of Session - 01/13/24 1435     Visit Number 2    Number of Visits 9    Date for PT Re-Evaluation 01/20/24    Authorization Type Medicare part A and B    Authorization Time Period No auth, No limit    PT Start Time 1435    PT Stop Time 1513    PT Time Calculation (min) 38 min    Activity Tolerance Patient tolerated treatment well    Behavior During Therapy WFL for tasks assessed/performed             Past Medical History:  Diagnosis Date   CKD (chronic kidney disease)    GERD (gastroesophageal reflux disease)    Heart murmur    HLD (hyperlipidemia)    HTN (hypertension)    Past Surgical History:  Procedure Laterality Date   ABDOMINAL HYSTERECTOMY     BIOPSY  06/04/2022   Procedure: BIOPSY;  Surgeon: Suzette Espy, MD;  [facility];  Service: Endoscopy;;   COLONOSCOPY     years ago with polyps per patient   ESOPHAGOGASTRODUODENOSCOPY (EGD) WITH PROPOFOL  N/A 06/04/2022   Procedure: ESOPHAGOGASTRODUODENOSCOPY (EGD) WITH PROPOFOL ;  Surgeon: Suzette Espy, MD;  [facility];  Service: Endoscopy;  Laterality: N/A;  11:00 am   Patient Active Problem List   Diagnosis Date Noted   Oral candidiasis 11/14/2022   Gas bloat syndrome 11/14/2022   LUQ pain 07/16/2022   Constipation 04/28/2022   Epigastric burning sensation 04/28/2022   SYSTOLIC MURMUR 09/26/2008   Osteoporosis 12/22/2006   HYPERLIPIDEMIA 08/19/2006   Essential hypertension 08/19/2006   Gastroesophageal reflux disease 08/19/2006   IBS 08/19/2006   History of colonic polyps 08/19/2006    PCP: Omie Bickers, MD  REFERRING PROVIDER: Omie Bickers, MD  REFERRING DIAG: low back pain  Rationale for Evaluation and Treatment: Rehabilitation  THERAPY DIAG:  Other low back pain  Decreased ROM of lumbar  spine  Impaired functional mobility, balance, gait, and endurance  Muscle weakness (generalized)  ONSET DATE: About 6 months  SUBJECTIVE:                                                                                                                                                                                           SUBJECTIVE STATEMENT: Pt reports back was doing good until Sunday when she went to [place] to see a family member and did a lot of walking. Since then  hip joint and back has been hurting. Rates pain as 6/10. Reports HEP has been going well.   Patient reports low back pain that is Not a constant pain. Whenever she stands after sitting for long periods it hurts initially but after walking a little it goes away. Describes pain as a catch. Sometimes travel to R hip, and that also feels like a catch. Feels pain when bending and Lifting from ground.   PERTINENT HISTORY:  GERD  PAIN:  Are you having pain? No  PRECAUTIONS: None  RED FLAGS: Bowel or bladder incontinence: No   WEIGHT BEARING RESTRICTIONS: No  FALLS:  Has patient fallen in last 6 months? 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   PATIENT SURVEYS:  Modified Oswestry Low Back Pain Disability Questionnaire: 11 / 50 = 22.0 %  COGNITION: Overall cognitive status: Within functional limits for tasks assessed     SENSATION: WFL  MUSCLE LENGTH: Hamstrings: Right ~130 deg; Left ~130 deg   POSTURE: rounded shoulders and forward head  PALPATION: TTP t/o lumbar all vertebrae and paraspinals. Muscle spasms occurring with grade 1-2 mobs. Inc pain with pressure on R hip when in side-lying.   LUMBAR ROM:   AROM eval  Flexion WFL  Extension 50%, pain  on L side Low back   Right lateral flexion 25%, inc difficulty  Left lateral flexion 50%  Right rotation WFL  Left rotation WFL   (Blank rows = not tested)  LOWER EXTREMITY ROM:     Active  Right eval Left eval  Hip flexion    Hip extension    Hip abduction    Hip adduction    Hip internal rotation    Hip external rotation    Knee flexion    Knee extension    Ankle dorsiflexion    Ankle plantarflexion    Ankle inversion    Ankle eversion     (Blank rows = not tested)  LOWER EXTREMITY MMT:    MMT Right eval Left eval  Hip flexion    Hip extension 3+ 3+  Hip abduction 4- 4-  Hip adduction    Hip internal rotation    Hip external rotation    Knee flexion    Knee extension    Ankle dorsiflexion 5 5  Ankle plantarflexion    Ankle inversion    Ankle eversion     (Blank rows = not tested)  LUMBAR SPECIAL TESTS:    FUNCTIONAL TESTS:  30 seconds chair stand test 2 minute walk test:   30 sec chair rise: 7.5 LB feels a little sore after, pt with one stumble in posterior direction  : 168', dec   GAIT: Distance walked: 168' Assistive device utilized: None Level of assistance: Complete Independence Comments: Dec velocity, dec step lengths bilat, no LOB, dec hip ext bilat  TREATMENT DATE:  01/13/24: Review of goals and HEP Supine LTR Supine hamstring stretch, 30" Seated hamstring stretch, 30" Supine Piriformis stretch, 30" Supine TA contraction, 10x5" holds Standing hip extension, 2x10 Standing hip abduction, 2x10 STS, 10x  10x feet on foam  12/23/23: PT Eval and HEP  PATIENT EDUCATION:  Education details: PT evaluation, objective findings, POC, Importance of HEP, Precautions, Clinic policies  Person educated: Patient Education method: Explanation and Demonstration Education comprehension: verbalized understanding and returned  demonstration  HOME EXERCISE PROGRAM: Access Code: [email or web address] URL: [email or web address] Date: 12/23/2023 Prepared by: Virgia Griffins Powell-Butler  Exercises - Supine Lower Trunk Rotation  - 1 x daily - 7 x weekly - 3 sets - 10 reps - Hooklying Hamstring Stretch with Strap  - 1 x daily - 7 x weekly - 3 sets - 10 reps - Hooklying Single Knee to Chest Stretch  - 1 x daily - 7 x weekly - 3 sets - 10 reps  Access Code: [email or web address] URL: [email or web address] Date: 01/13/2024 Prepared by: Virgia Griffins Powell-Butler  Exercises - Supine Piriformis Stretch with Foot on Ground  - 2 x daily - 7 x weekly - 2 sets - 10 reps - 30 hold - Supine Transversus Abdominis Bracing - Hands on Stomach  - 2 x daily - 7 x weekly - 2 sets - 10 reps - 10 hold - Seated Hamstring Stretch  - 2 x daily - 7 x weekly - 2 sets - 30 hold - Standing Hip Extension with Counter Support  - 1 x daily - 7 x weekly - 3 sets - 10 reps  ASSESSMENT:  CLINICAL IMPRESSION: Patient tolerated session well on this date. Review of goals and HEP. Patient requiring some verbal cueing for form throughout all HEP. Patient demonstrates inc discomfort describing it as feeling like her bones are rubbing together in R hip area. Eduction given on possible arthritic pain and that mobility would be most beneficial. Addition of piriformis stretch, TA contraction in supine, and standing Hip extension strengthening exercises. Ends with STS from lowered height, progressing to STS with feet on foam, patient requires CGA and at times multiple attempts to complete some stands due to balance. Patient will benefit from continued skilled physical therapy in order to improve LE strength, core strength, endurance, and balance to improve overall function and reduce daily pain.   Patient is a 89 y.o. female who was seen today for physical therapy evaluation and treatment for low back pain. Patient demonstrates decreased ROM in lumbar spine, tight  hamstring musculature, altered gait mechanics, and decreased LE strength which all may be contributing to her increased pain, specifically with transitional movements and limiting her performance of functional tasks such as bending, lifting, ambulation, and prolonged sitting/standing. Patient will benefit from skilled physical therapy in order to address the above to improve overall function and QOL.   OBJECTIVE IMPAIRMENTS: Abnormal gait, decreased activity tolerance, decreased balance, decreased endurance, decreased mobility, decreased strength, impaired flexibility, and pain.   ACTIVITY LIMITATIONS: bending, sitting, standing, squatting, stairs, and transfers  PARTICIPATION LIMITATIONS: cleaning, laundry, occupation, and yard work  [facility] POTENTIAL: Fair    CLINICAL DECISION MAKING: Stable/uncomplicated  EVALUATION COMPLEXITY: Low   GOALS: Goals reviewed with patient? Yes  SHORT TERM GOALS: Target date: 01/06/24  Patient will be independent with performance of HEP to demonstrate adequate self management of symptoms.  Baseline:  Goal status: INITIAL  2.   Patient will report at least a 25% improvement with function or pain overall since beginning PT. Baseline:  Goal status: INITIAL   LONG TERM GOALS: Target date: 01/20/24  Patient will improve Oswestry score by 6 points in order to improve self-perceived disability and overall function.  Baseline: See above Goal status: INITIAL  2.  Patient will improve 30 sec chair rise score to at least  12 STS in order to display improve LE strength and power for functional tasks  .  Baseline: See above Goal status: INITIAL  3.  Patient will improve test by at least 43ft  in order to demonstrate improved LE and overall endurance. Baseline: See above Goal status: INITIAL  4.  Patient will report overall 50% improvement since beginning PT. Baseline:  Goal status: INITIAL   PLAN:  PT FREQUENCY: 2x/week  PT DURATION: 4  weeks  PLANNED INTERVENTIONS: 97164- PT Re-evaluation, 97110-Therapeutic exercises, 97530- Therapeutic activity, [id number]- Neuromuscular re-education, 97535- Self Care, [id number]- Manual therapy, [phone]- Gait training, Patient/Family education, Joint mobilization, Spinal mobilization, and Moist heat.  PLAN FOR NEXT SESSION: Progress low back mobility, Manual STM to paraspinals, balance, LE/hip strengthening, Core strengthening   3:17 PM, 01/13/24 Cruise Baumgardner Powell-Butler, PT, DPT [facility] [place]

## 2024-01-15 ENCOUNTER — Encounter (HOSPITAL_COMMUNITY): Payer: Self-pay

## 2024-01-15 ENCOUNTER — Ambulatory Visit (HOSPITAL_COMMUNITY): Attending: Internal Medicine

## 2024-01-15 DIAGNOSIS — M5386 Other specified dorsopathies, lumbar region: Secondary | ICD-10-CM | POA: Insufficient documentation

## 2024-01-15 DIAGNOSIS — M6281 Muscle weakness (generalized): Secondary | ICD-10-CM | POA: Insufficient documentation

## 2024-01-15 DIAGNOSIS — M5459 Other low back pain: Secondary | ICD-10-CM | POA: Diagnosis not present

## 2024-01-15 DIAGNOSIS — Z7409 Other reduced mobility: Secondary | ICD-10-CM | POA: Diagnosis not present

## 2024-01-15 NOTE — Therapy (Signed)
 OUTPATIENT PHYSICAL THERAPY THORACOLUMBAR EVALUATION   Patient Name: Megan Mcguire MRN: [id number] DOB:Jan 21, 1931, 89 y.o., female Today's Date: 01/15/2024  END OF SESSION:  PT End of Session - 01/15/24 1349     Visit Number 3    Number of Visits 9    Date for PT Re-Evaluation 01/20/24    Authorization Type Medicare part A and B    Authorization Time Period No auth, No limit    PT Start Time 1350    PT Stop Time 1428    PT Time Calculation (min) 38 min    Activity Tolerance Patient tolerated treatment well    Behavior During Therapy WFL for tasks assessed/performed             Past Medical History:  Diagnosis Date   CKD (chronic kidney disease)    GERD (gastroesophageal reflux disease)    Heart murmur    HLD (hyperlipidemia)    HTN (hypertension)    Past Surgical History:  Procedure Laterality Date   ABDOMINAL HYSTERECTOMY     BIOPSY  06/04/2022   Procedure: BIOPSY;  Surgeon: Suzette Espy, MD;  [facility];  Service: Endoscopy;;   COLONOSCOPY     years ago with polyps per patient   ESOPHAGOGASTRODUODENOSCOPY (EGD) WITH PROPOFOL  N/A 06/04/2022   Procedure: ESOPHAGOGASTRODUODENOSCOPY (EGD) WITH PROPOFOL ;  Surgeon: Suzette Espy, MD;  [facility];  Service: Endoscopy;  Laterality: N/A;  11:00 am   Patient Active Problem List   Diagnosis Date Noted   Oral candidiasis 11/14/2022   Gas bloat syndrome 11/14/2022   LUQ pain 07/16/2022   Constipation 04/28/2022   Epigastric burning sensation 04/28/2022   SYSTOLIC MURMUR 09/26/2008   Osteoporosis 12/22/2006   HYPERLIPIDEMIA 08/19/2006   Essential hypertension 08/19/2006   Gastroesophageal reflux disease 08/19/2006   IBS 08/19/2006   History of colonic polyps 08/19/2006    PCP: Omie Bickers, MD  REFERRING PROVIDER: Omie Bickers, MD  REFERRING DIAG: low back pain  Rationale for Evaluation and Treatment: Rehabilitation  THERAPY DIAG:  Other low back pain  Decreased ROM of lumbar  spine  Impaired functional mobility, balance, gait, and endurance  Muscle weakness (generalized)  ONSET DATE: About 6 months  SUBJECTIVE:                                                                                                                                                                                           SUBJECTIVE STATEMENT: Pt reports 6/10 pain with transfers. Otherwise feeling okay.   Patient reports low back pain that is Not a constant pain. Whenever she stands after sitting  for long periods it hurts initially but after walking a little it goes away. Describes pain as a catch. Sometimes travel to R hip, and that also feels like a catch. Feels pain when bending and Lifting from ground.   PERTINENT HISTORY:  GERD  PAIN:  Are you having pain? No  PRECAUTIONS: None  RED FLAGS: Bowel or bladder incontinence: No   WEIGHT BEARING RESTRICTIONS: No  FALLS:  Has patient fallen in last 6 months? 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   PATIENT SURVEYS:  Modified Oswestry Low Back Pain Disability Questionnaire: 11 / 50 = 22.0 %  COGNITION: Overall cognitive status: Within functional limits for tasks assessed     SENSATION: WFL  MUSCLE LENGTH: Hamstrings: Right ~130 deg; Left ~130 deg   POSTURE: rounded shoulders and forward head  PALPATION: TTP t/o lumbar all vertebrae and paraspinals. Muscle spasms occurring with grade 1-2 mobs. Inc pain with pressure on R hip when in side-lying.   LUMBAR ROM:   AROM eval  Flexion WFL  Extension 50%, pain on L side Low back   Right lateral flexion 25%, inc difficulty  Left lateral flexion 50%  Right rotation WFL  Left rotation WFL   (Blank rows = not  tested)  LOWER EXTREMITY ROM:     Active  Right eval Left eval  Hip flexion    Hip extension    Hip abduction    Hip adduction    Hip internal rotation    Hip external rotation    Knee flexion    Knee extension    Ankle dorsiflexion    Ankle plantarflexion    Ankle inversion    Ankle eversion     (Blank rows = not tested)  LOWER EXTREMITY MMT:    MMT Right eval Left eval  Hip flexion    Hip extension 3+ 3+  Hip abduction 4- 4-  Hip adduction    Hip internal rotation    Hip external rotation    Knee flexion    Knee extension    Ankle dorsiflexion 5 5  Ankle plantarflexion    Ankle inversion    Ankle eversion     (Blank rows = not tested)  LUMBAR SPECIAL TESTS:    FUNCTIONAL TESTS:  30 seconds chair stand test 2 minute walk test:   30 sec chair rise: 7.5 LB feels a little sore after, pt with one stumble in posterior direction  : 168', dec   GAIT: Distance walked: 168' Assistive device utilized: None Level of assistance: Complete Independence Comments: Dec velocity, dec step lengths bilat, no LOB, dec hip ext bilat  TREATMENT DATE:  01/15/24: Supine: LTR, 2x10 TA Contraction, 5"x10 Bridge, 10x2 Hip abd / TA contract, YTB,  Hip add w/ TA contract, Yellow ball STS, 2x10  2nd set with YTB around knee to limit valgus moment  Standing hip extension in // bars: 12x ea LE Side Steps in // bars: 2x down and back  Forward marching in // bars: 2x down and back  Forward step ups, 8 inch step, 10x2 ea LE leading, BUE support-->oneUE support  01/13/24: Review of goals and HEP Supine LTR Supine hamstring stretch, 30" Seated hamstring stretch, 30" Supine Piriformis stretch, 30" Supine TA contraction,  10x5" holds Standing hip extension, 2x10 Standing hip abduction, 2x10 STS, 10x  10x feet on foam  12/23/23: PT Eval and HEP                                                                                                                                  PATIENT EDUCATION:  Education details: PT evaluation, objective findings, POC, Importance of HEP, Precautions, Clinic policies  Person educated: Patient Education method: Explanation and Demonstration Education comprehension: verbalized understanding and returned demonstration  HOME EXERCISE PROGRAM: Access Code: [email or web address] URL: [email or web address] Date: 12/23/2023 Prepared by: Virgia Griffins Powell-Butler  Exercises - Supine Lower Trunk Rotation  - 1 x daily - 7 x weekly - 3 sets - 10 reps - Hooklying Hamstring Stretch with Strap  - 1 x daily - 7 x weekly - 3 sets - 10 reps - Hooklying Single Knee to Chest Stretch  - 1 x daily - 7 x weekly - 3 sets - 10 reps  Access Code: [email or web address] URL: [email or web address] Date: 01/13/2024 Prepared by: Virgia Griffins Powell-Butler  Exercises - Supine Piriformis Stretch with Foot on Ground  - 2 x daily - 7 x weekly - 2 sets - 10 reps - 30 hold - Supine Transversus Abdominis Bracing - Hands on Stomach  - 2 x daily - 7 x weekly - 2 sets - 10 reps - 10 hold - Seated Hamstring Stretch  - 2 x daily - 7 x weekly - 2 sets - 30 hold - Standing Hip Extension with Counter Support  - 1 x daily - 7 x weekly - 3 sets - 10 reps  ASSESSMENT:  CLINICAL IMPRESSION: Patient tolerated session well. Reports pain only with transitional motion. Session began with supine core and LE mobility and strengthening exercises. Addition of bridges and hip abduction to HEP. During STS, valgus moment with knees bilat. YTB around knees corrects. Reports some soreness in low back at end of STS that improves with seated rest. Reports soreness in R hip bone with hip extension and forward marching. Reports pain as barely there but noticeable. Decreases UE support during step ups from BUE to one UE. Attempts as no UE but unable to complete safely d/t weakness and imbalance. Patient will benefit from continued skilled physical therapy in order to improve LE strength, core  strength, endurance, and balance to improve overall function and reduce daily pain.   Patient is a 89 y.o. female who was seen today for physical therapy evaluation and treatment for low back pain. Patient demonstrates decreased ROM in lumbar spine, tight hamstring musculature, altered gait mechanics, and decreased LE strength which all may be contributing to her increased pain, specifically with transitional movements and limiting her performance of functional tasks such as bending, lifting, ambulation, and prolonged sitting/standing. Patient will benefit from skilled physical therapy in order to address the above to improve overall function and QOL.   OBJECTIVE IMPAIRMENTS: Abnormal gait, decreased activity tolerance, decreased balance,  decreased endurance, decreased mobility, decreased strength, impaired flexibility, and pain.   ACTIVITY LIMITATIONS: bending, sitting, standing, squatting, stairs, and transfers  PARTICIPATION LIMITATIONS: cleaning, laundry, occupation, and yard work  [facility] POTENTIAL: Fair    CLINICAL DECISION MAKING: Stable/uncomplicated  EVALUATION COMPLEXITY: Low   GOALS: Goals reviewed with patient? Yes  SHORT TERM GOALS: Target date: 01/06/24  Patient will be independent with performance of HEP to demonstrate adequate self management of symptoms.  Baseline:  Goal status: INITIAL  2.   Patient will report at least a 25% improvement with function or pain overall since beginning PT. Baseline:  Goal status: INITIAL   LONG TERM GOALS: Target date: 01/20/24  Patient will improve Oswestry score by 6 points in order to improve self-perceived disability and overall function.  Baseline: See above Goal status: INITIAL  2.  Patient will improve 30 sec chair rise score to at least 12 STS in order to display improve LE strength and power for functional tasks  .  Baseline: See above Goal status: INITIAL  3.  Patient will improve test by at least 93ft  in order to  demonstrate improved LE and overall endurance. Baseline: See above Goal status: INITIAL  4.  Patient will report overall 50% improvement since beginning PT. Baseline:  Goal status: INITIAL   PLAN:  PT FREQUENCY: 2x/week  PT DURATION: 4 weeks  PLANNED INTERVENTIONS: 97164- PT Re-evaluation, 97110-Therapeutic exercises, 97530- Therapeutic activity, [id number]- Neuromuscular re-education, 97535- Self Care, 16109- Manual therapy, [phone]- Gait training, Patient/Family education, Joint mobilization, Spinal mobilization, and Moist heat.  PLAN FOR NEXT SESSION: Progress low back mobility, balance, LE/hip strengthening, Core strengthening, postural exer.     2:30 PM, 01/15/24 Marysue Sola, PT, DPT [facility] [place]

## 2024-01-20 ENCOUNTER — Encounter (HOSPITAL_COMMUNITY): Payer: Self-pay

## 2024-01-20 ENCOUNTER — Ambulatory Visit (HOSPITAL_COMMUNITY)

## 2024-01-20 DIAGNOSIS — M6281 Muscle weakness (generalized): Secondary | ICD-10-CM | POA: Diagnosis not present

## 2024-01-20 DIAGNOSIS — M5459 Other low back pain: Secondary | ICD-10-CM | POA: Diagnosis not present

## 2024-01-20 DIAGNOSIS — Z7409 Other reduced mobility: Secondary | ICD-10-CM

## 2024-01-20 DIAGNOSIS — M5386 Other specified dorsopathies, lumbar region: Secondary | ICD-10-CM | POA: Diagnosis not present

## 2024-01-20 NOTE — Therapy (Addendum)
 OUTPATIENT PHYSICAL THERAPY THORACOLUMBAR EVALUATION   Patient Name: Megan Mcguire MRN: [id number] DOB:1931-02-01, 89 y.o., female Today's Date: 01/20/2024  END OF SESSION:  PT End of Session - 01/20/24 1300     Visit Number 4    Number of Visits 9    Date for PT Re-Evaluation 01/27/24    Authorization Type Medicare part A and B    Authorization Time Period No auth, No limit    PT Start Time 1301    PT Stop Time 1344    PT Time Calculation (min) 43 min    Activity Tolerance Patient tolerated treatment well    Behavior During Therapy WFL for tasks assessed/performed             Past Medical History:  Diagnosis Date   CKD (chronic kidney disease)    GERD (gastroesophageal reflux disease)    Heart murmur    HLD (hyperlipidemia)    HTN (hypertension)    Past Surgical History:  Procedure Laterality Date   ABDOMINAL HYSTERECTOMY     BIOPSY  06/04/2022   Procedure: BIOPSY;  Surgeon: Suzette Espy, MD;  [facility];  Service: Endoscopy;;   COLONOSCOPY     years ago with polyps per patient   ESOPHAGOGASTRODUODENOSCOPY (EGD) WITH PROPOFOL  N/A 06/04/2022   Procedure: ESOPHAGOGASTRODUODENOSCOPY (EGD) WITH PROPOFOL ;  Surgeon: Suzette Espy, MD;  [facility];  Service: Endoscopy;  Laterality: N/A;  11:00 am   Patient Active Problem List   Diagnosis Date Noted   Oral candidiasis 11/14/2022   Gas bloat syndrome 11/14/2022   LUQ pain 07/16/2022   Constipation 04/28/2022   Epigastric burning sensation 04/28/2022   SYSTOLIC MURMUR 09/26/2008   Osteoporosis 12/22/2006   HYPERLIPIDEMIA 08/19/2006   Essential hypertension 08/19/2006   Gastroesophageal reflux disease 08/19/2006   IBS 08/19/2006   History of colonic polyps 08/19/2006    PCP: Omie Bickers, MD  REFERRING PROVIDER: Omie Bickers, MD  REFERRING DIAG: low back pain  Rationale for Evaluation and Treatment: Rehabilitation  THERAPY DIAG:  Other low back pain  Decreased ROM of lumbar  spine  Impaired functional mobility, balance, gait, and endurance  Muscle weakness (generalized)  ONSET DATE: About 6 months  SUBJECTIVE:                                                                                                                                                                                           SUBJECTIVE STATEMENT: Pt reports 5/10 pain with transfers. That's the only time she feels her pain.   Patient reports low back pain that is Not a constant pain.  Whenever she stands after sitting for long periods it hurts initially but after walking a little it goes away. Describes pain as a catch. Sometimes travel to R hip, and that also feels like a catch. Feels pain when bending and Lifting from ground.   PERTINENT HISTORY:  GERD  PAIN:  Are you having pain? No  PRECAUTIONS: None  RED FLAGS: Bowel or bladder incontinence: No   WEIGHT BEARING RESTRICTIONS: No  FALLS:  Has patient fallen in last 6 months? 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   PATIENT SURVEYS:  Modified Oswestry Low Back Pain Disability Questionnaire: 11 / 50 = 22.0 %  COGNITION: Overall cognitive status: Within functional limits for tasks assessed     SENSATION: WFL  MUSCLE LENGTH: Hamstrings: Right ~130 deg; Left ~130 deg   POSTURE: rounded shoulders and forward head  PALPATION: TTP t/o lumbar all vertebrae and paraspinals. Muscle spasms occurring with grade 1-2 mobs. Inc pain with pressure on R hip when in side-lying.   LUMBAR ROM:   AROM eval  Flexion WFL  Extension 50% avail, pain on L side Low back   Right lateral flexion 25% avail, inc difficulty  Left lateral flexion 50% avail  Right rotation WFL  Left rotation  WFL   (Blank rows = not tested)  LOWER EXTREMITY ROM:     Active  Right eval Left eval  Hip flexion    Hip extension    Hip abduction    Hip adduction    Hip internal rotation    Hip external rotation    Knee flexion    Knee extension    Ankle dorsiflexion    Ankle plantarflexion    Ankle inversion    Ankle eversion     (Blank rows = not tested)  LOWER EXTREMITY MMT:    MMT Right eval Left eval  Hip flexion    Hip extension 3+ 3+  Hip abduction 4- 4-  Hip adduction    Hip internal rotation    Hip external rotation    Knee flexion    Knee extension    Ankle dorsiflexion 5 5  Ankle plantarflexion    Ankle inversion    Ankle eversion     (Blank rows = not tested)  LUMBAR SPECIAL TESTS:    FUNCTIONAL TESTS:  30 seconds chair stand test 2 minute walk test:   30 sec chair rise: 7.5 LB feels a little sore after, pt with one stumble in posterior direction  : 168'  GAIT: Distance walked: 60' Assistive device utilized: None Level of assistance: Complete Independence Comments: Dec velocity, dec step lengths bilat, no LOB, dec hip ext bilat  TREATMENT DATE:  01/20/24: NuStep, Seat 4, lvl 1, 5' Seated forward flexion rollout with physioball, 5" holds To R and L 10x ea STS w/ TA contraction, 10x  YTB around knees, 10x Core set, pushing physioball into mat with BUE forward, 5" holdsx10  Maintained core set w/ one UE, standing sideways, march with LEs Supine TA Contraction with march, 2x10 Bridge w/ TA contraction, 10x2 TA contraction with heel taps, 10x2  01/15/24: Supine: LTR, 2x10 TA Contraction, 5"x10 Bridge, 10x2 Hip abd / TA contract, YTB,  Hip add w/ TA contract, Yellow ball STS, 2x10  2nd set with YTB around knee to limit valgus moment  Standing hip extension in // bars: 12x ea LE Side Steps in // bars: 2x down and back  Forward marching in // bars: 2x down and back  Forward step ups, 8 inch step, 10x2 ea LE leading, BUE support-->oneUE  support  01/13/24: Review of goals and HEP Supine LTR Supine hamstring stretch, 30" Seated hamstring stretch, 30" Supine Piriformis stretch, 30" Supine TA contraction, 10x5" holds Standing hip extension, 2x10 Standing hip abduction, 2x10 STS, 10x  10x feet on foam    PATIENT EDUCATION:  Education details: PT evaluation, objective findings, POC, Importance of HEP, Precautions, Clinic policies  Person educated: Patient Education method: Explanation and Demonstration Education comprehension: verbalized understanding and returned demonstration  HOME EXERCISE PROGRAM: Access Code: [email or web address] URL: [email or web address] Date: 12/23/2023 Prepared by: Virgia Griffins Powell-Butler  Exercises - Supine Lower Trunk Rotation  - 1 x daily - 7 x weekly - 3 sets - 10 reps - Hooklying Hamstring Stretch with Strap  - 1 x daily - 7 x weekly - 3 sets - 10 reps - Hooklying Single Knee to Chest Stretch  - 1 x daily - 7 x weekly - 3 sets - 10 reps  Access Code: [email or web address] URL: [email or web address] Date: 01/13/2024 Prepared by: Virgia Griffins Powell-Butler  Exercises - Supine Piriformis Stretch with Foot on Ground  - 2 x daily - 7 x weekly - 2 sets - 10 reps - 30 hold - Supine Transversus Abdominis Bracing - Hands on Stomach  - 2 x daily - 7 x weekly - 2 sets - 10 reps - 10 hold - Seated Hamstring Stretch  - 2 x daily - 7 x weekly - 2 sets - 30 hold - Standing Hip Extension with Counter Support  - 1 x daily - 7 x weekly - 3 sets - 10 reps  Exercises - Isometric Dead Bug  - 2 x daily - 7 x weekly - 2 sets - 10 reps - Supine March  - 2 x daily - 7 x weekly - 2 sets - 10 reps - Supine Bridge  - 2 x daily - 7 x weekly - 2 sets - 10 reps  ASSESSMENT:  CLINICAL IMPRESSION: Session begins with general warm up on NuStep. Patient continues to have increased pain with sit to stand specifically. Followed with forward lumbar flexion in seated position to loosen low back musculature. Sit to  stands next with emphasis on TA contraction. Pt reporting improvement with pain during. Remainder of session focused on core strengthening in standing and supine positions. Pt requiring verbal cueing for form and breath control t/o.  After supine>sit and last STS, pt reports dec pain during transfer. Addition of bridge, TA march, and TA heel taps to HEP. Patient will benefit from continued skilled physical therapy in order to improve LE strength, core strength, endurance, and balance to improve overall function and reduce daily pain. Progress note to be performed next visit.    Patient is a 89 y.o. female who was seen today for physical therapy evaluation and treatment for low back pain. Patient demonstrates decreased ROM in lumbar spine, tight hamstring musculature, altered gait mechanics, and decreased LE strength which all may be contributing to her increased pain, specifically with transitional movements and limiting her performance of functional tasks such as bending, lifting, ambulation, and prolonged sitting/standing. Patient will benefit from skilled physical therapy in order to address the above to improve overall function and QOL.   OBJECTIVE IMPAIRMENTS: Abnormal gait,  decreased activity tolerance, decreased balance, decreased endurance, decreased mobility, decreased strength, impaired flexibility, and pain.   ACTIVITY LIMITATIONS: bending, sitting, standing, squatting, stairs, and transfers  PARTICIPATION LIMITATIONS: cleaning, laundry, occupation, and yard work  [facility] POTENTIAL: Fair    CLINICAL DECISION MAKING: Stable/uncomplicated  EVALUATION COMPLEXITY: Low   GOALS: Goals reviewed with patient? Yes  SHORT TERM GOALS: Target date: 01/06/24  Patient will be independent with performance of HEP to demonstrate adequate self management of symptoms.  Baseline:  Goal status: INITIAL  2.   Patient will report at least a 25% improvement with function or pain overall since beginning  PT. Baseline:  Goal status: INITIAL   LONG TERM GOALS: Target date: 01/20/24  Patient will improve Oswestry score by 6 points in order to improve self-perceived disability and overall function.  Baseline: See above Goal status: INITIAL  2.  Patient will improve 30 sec chair rise score to at least 12 STS in order to display improve LE strength and power for functional tasks  .  Baseline: See above Goal status: INITIAL  3.  Patient will improve test by at least 65ft  in order to demonstrate improved LE and overall endurance. Baseline: See above Goal status: INITIAL  4.  Patient will report overall 50% improvement since beginning PT. Baseline:  Goal status: INITIAL   PLAN:  PT FREQUENCY: 2x/week  PT DURATION: 4 weeks  PLANNED INTERVENTIONS: 97164- PT Re-evaluation, 97110-Therapeutic exercises, 97530- Therapeutic activity, [id number]- Neuromuscular re-education, 97535- Self Care, [id number]- Manual therapy, [phone]- Gait training, Patient/Family education, Joint mobilization, Spinal mobilization, and Moist heat.  PLAN FOR NEXT SESSION: Progress low back mobility, balance, LE/hip strengthening, Core strengthening, postural exer.    1:59 PM, 01/20/24 Marysue Sola, PT, DPT [facility] [place]

## 2024-01-22 ENCOUNTER — Telehealth (HOSPITAL_COMMUNITY): Payer: Self-pay

## 2024-01-22 ENCOUNTER — Encounter (HOSPITAL_COMMUNITY)

## 2024-01-22 NOTE — Telephone Encounter (Signed)
 Called patient regarding missed appointment on this date. No answer, left voicemail for patient reminding of next appointment on 01/27/24 at 1:00 pm.    4:51 PM, 01/22/24 Marysue Sola, PT, DPT [facility] [place]

## 2024-01-24 NOTE — Progress Notes (Deleted)
 GI Office Note    Referring Provider: Omie Bickers, MD Primary Care Physician:  Omie Bickers, MD Primary Gastroenterologist: Windsor Hatcher.Rourk, MD  Date:  01/24/2024  ID:  Megan Mcguire, DOB 07-Dec-1930, MRN [id number]   Chief Complaint   No chief complaint on file.   History of Present Illness  Megan Mcguire is a 89 y.o. female with a history of chronic GERD and epigastric pain/LUQ pain with slight improvement on PPI twice daily, chronic constipation, and chronically on Librax twice daily presenting today for follow up constipation.   EGD September 2023: -Distal esophageal nodule uncertain significance status post biopsy.  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  -Small hiatal hernia.  -Patchy gastric erythema status post biopsy; mild nonspecific reactive gastropathy, no H. pylori -normal duodenal bulb and second portion of the duodenum aside from single prominent lymphangitic appearing lesion in the second portion of the duodenum, biopsy c/w duodenal lymphangiectasia   OV 06/01/2023.  Patient reported having ongoing epigastric and left upper quadrant burning since her last office visit.  Denies any actual pain but states it is an annoyance.  Improved by eating which she admitted to was the same as since her prior EGD.  Belching with a sour taste and some early satiety but denied any actual nausea or vomiting.  Taking her pantoprazole 40 mg twice a day.  Taking MiraLAX daily but not having bowel movements daily and having incomplete emptying.  Mild left lower quadrant pain that improves with bowel movement.  Taking digestive advantage which helps some, taking for about a year.  Advised to stop pantoprazole and start Nexium  40 mg twice a day, reinforced GERD diet.  Advised to start Linzess  daily and provided samples.Patient requested prescription for Linzess  72 mcg daily earlier November 2025.   Last office visit  07/27/23. Unable to afford Linzess  so she went back to miralax once daily. BM every other day. Emptying more effective and no abdominal pain. Prune juice helpful at times. Gassiness had been better since being on Nexium . No big changes to appetite. Advised that she could increase her miralax to twice daily. Continue PPI BID, could consider trial of amitiza, ibsrella, trulance. Follow up in 6 months.    Today:    Wt Readings from Last 3 Encounters:  07/27/23 131 lb (59.4 kg)  06/01/23 129 lb 3.2 oz (58.6 kg)  03/02/23 130 lb 9.6 oz (59.2 kg)    Current Outpatient Medications  Medication Sig Dispense Refill   amLODipine-olmesartan (AZOR) 10-40 MG tablet Take 1 tablet by mouth daily.     aspirin EC 81 MG tablet Take 81 mg by mouth daily.     atorvastatin  (LIPITOR) 10 MG tablet Take 1 tablet (10 mg total) by mouth daily. 30 tablet 0   cholecalciferol (VITAMIN D3) 25 MCG (1000 UNIT) tablet Take 1,000 Units by mouth daily.     clidinium-chlordiazePOXIDE  (LIBRAX) 5-2.5 MG capsule Take 1 capsule by mouth in the morning and at bedtime. 180 capsule 3   esomeprazole  (NEXIUM ) 40 MG capsule Take 1 capsule (40 mg total) by mouth 2 (two) times daily before a meal. 180 capsule 3   linaclotide  (LINZESS ) 72 MCG capsule Take 1 capsule (72 mcg total) by mouth daily before breakfast. 30 capsule 5   metoprolol succinate (TOPROL-XL) 50 MG 24 hr tablet Take 50 mg by mouth daily.     polyethylene glycol (MIRALAX / GLYCOLAX) 17 g packet Take 17 g by  mouth daily.     Simethicone 125 MG CAPS Take 125 mg by mouth every 6 (six) hours as needed for flatulence.     No current facility-administered medications for this visit.    Past Medical History:  Diagnosis Date   CKD (chronic kidney disease)    GERD (gastroesophageal reflux disease)    Heart murmur    HLD (hyperlipidemia)    HTN (hypertension)     Past Surgical History:  Procedure Laterality Date   ABDOMINAL HYSTERECTOMY     BIOPSY  06/04/2022    Procedure: BIOPSY;  Surgeon: Suzette Espy, MD;  [facility];  Service: Endoscopy;;   COLONOSCOPY     years ago with polyps per patient   ESOPHAGOGASTRODUODENOSCOPY (EGD) WITH PROPOFOL  N/A 06/04/2022   Procedure: ESOPHAGOGASTRODUODENOSCOPY (EGD) WITH PROPOFOL ;  Surgeon: Suzette Espy, MD;  [facility];  Service: Endoscopy;  Laterality: N/A;  11:00 am    Family History  Problem Relation Age of Onset   Hypertension Mother    Heart failure Father     Allergies as of 01/25/2024   (No Known Allergies)    Social History   Socioeconomic History   Marital status: Married    Spouse name: Not on file   Number of children: 6   Years of education: Not on file   Highest education level: Not on file  Occupational History   Not on file  Tobacco Use   Smoking status: Never    Passive exposure: Never   Smokeless tobacco: Never  Vaping Use   Vaping status: Never Used  Substance and Sexual Activity   Alcohol use: Not Currently   Drug use: Never   Sexual activity: Not Currently  Other Topics Concern   Not on file  Social History Narrative   Not on file   Social Drivers of Health   Financial Resource Strain: Not on file  Food Insecurity: Not on file  Transportation Needs: Not on file  Physical Activity: Not on file  Stress: Not on file  Social Connections: Not on file     Review of Systems   Gen: Denies fever, chills, anorexia. Denies fatigue, weakness, weight loss.  CV: Denies chest pain, palpitations, syncope, peripheral edema, and claudication. Resp: Denies dyspnea at rest, cough, wheezing, coughing up blood, and pleurisy. GI: See HPI Derm: Denies rash, itching, dry skin Psych: Denies depression, anxiety, memory loss, confusion. No homicidal or suicidal ideation.  Heme: Denies bruising, bleeding, and enlarged lymph nodes.  Physical Exam   There were no vitals taken for this visit.  General:   Alert and oriented. No distress noted. Pleasant  and cooperative.  Head:  Normocephalic and atraumatic. Eyes:  Conjuctiva clear without scleral icterus. Mouth:  Oral mucosa pink and moist. Good dentition. No lesions. Lungs:  Clear to auscultation bilaterally. No wheezes, rales, or rhonchi. No distress.  Heart:  S1, S2 present without murmurs appreciated.  Abdomen:  +BS, soft, non-tender and non-distended. No rebound or guarding. No HSM or masses noted. Rectal: *** Msk:  Symmetrical without gross deformities. Normal posture. Extremities:  Without edema. Neurologic:  Alert and  oriented x4 Psych:  Alert and cooperative. Normal mood and affect.  Assessment  Megan Mcguire is a 89 y.o. female with a history of chronic GERD and epigastric pain/LUQ pain with slight improvement on PPI twice daily, chronic constipation, and chronically on Librax twice daily presenting today with   Chronic constipation: - - -  Chronic epigastric pain, GERD:  PLAN   ***     Julian Obey, MSN, FNP-BC, AGACNP-BC [facility]

## 2024-01-25 ENCOUNTER — Ambulatory Visit: Payer: Medicare Other | Admitting: Gastroenterology

## 2024-01-27 ENCOUNTER — Encounter (HOSPITAL_COMMUNITY)

## 2024-01-27 ENCOUNTER — Telehealth (HOSPITAL_COMMUNITY): Payer: Self-pay

## 2024-01-27 ENCOUNTER — Encounter: Payer: Self-pay | Admitting: Gastroenterology

## 2024-01-27 NOTE — Telephone Encounter (Signed)
 Called patient regarding missed appointment on this date. No answer. Reminded patient of next appointment on 01/29/24 at 1:45 pm and to call to cancel if she won't be able to attend.   1:45 PM, 01/27/24 Marysue Sola, PT, DPT [facility] [place]

## 2024-01-29 ENCOUNTER — Encounter (HOSPITAL_COMMUNITY): Payer: Self-pay

## 2024-01-29 ENCOUNTER — Ambulatory Visit (HOSPITAL_COMMUNITY)

## 2024-01-29 DIAGNOSIS — Z7409 Other reduced mobility: Secondary | ICD-10-CM | POA: Diagnosis not present

## 2024-01-29 DIAGNOSIS — M6281 Muscle weakness (generalized): Secondary | ICD-10-CM | POA: Diagnosis not present

## 2024-01-29 DIAGNOSIS — M5386 Other specified dorsopathies, lumbar region: Secondary | ICD-10-CM

## 2024-01-29 DIAGNOSIS — M5459 Other low back pain: Secondary | ICD-10-CM | POA: Diagnosis not present

## 2024-01-29 NOTE — Therapy (Signed)
 OUTPATIENT PHYSICAL THERAPY THORACOLUMBAR EVALUATION   Patient Name: Megan Mcguire MRN: [id number] DOB:Feb 15, 1931, 89 y.o., female Today's Date: 01/29/2024  END OF SESSION:  PT End of Session - 01/29/24 1357     Visit Number 5    Number of Visits 9    Date for PT Re-Evaluation 02/03/24    Authorization Type Medicare part A and B    Authorization Time Period No auth, No limit    Progress Note Due on Visit 10    PT Start Time 1346    PT Stop Time 1425    PT Time Calculation (min) 39 min    Activity Tolerance Patient tolerated treatment well    Behavior During Therapy WFL for tasks assessed/performed              Past Medical History:  Diagnosis Date   CKD (chronic kidney disease)    GERD (gastroesophageal reflux disease)    Heart murmur    HLD (hyperlipidemia)    HTN (hypertension)    Past Surgical History:  Procedure Laterality Date   ABDOMINAL HYSTERECTOMY     BIOPSY  06/04/2022   Procedure: BIOPSY;  Surgeon: Suzette Espy, MD;  [facility];  Service: Endoscopy;;   COLONOSCOPY     years ago with polyps per patient   ESOPHAGOGASTRODUODENOSCOPY (EGD) WITH PROPOFOL  N/A 06/04/2022   Procedure: ESOPHAGOGASTRODUODENOSCOPY (EGD) WITH PROPOFOL ;  Surgeon: Suzette Espy, MD;  [facility];  Service: Endoscopy;  Laterality: N/A;  11:00 am   Patient Active Problem List   Diagnosis Date Noted   Oral candidiasis 11/14/2022   Gas bloat syndrome 11/14/2022   LUQ pain 07/16/2022   Constipation 04/28/2022   Epigastric burning sensation 04/28/2022   SYSTOLIC MURMUR 09/26/2008   Osteoporosis 12/22/2006   HYPERLIPIDEMIA 08/19/2006   Essential hypertension 08/19/2006   Gastroesophageal reflux disease 08/19/2006   IBS 08/19/2006   History of colonic polyps 08/19/2006    PCP: Omie Bickers, MD  REFERRING PROVIDER: Omie Bickers, MD  REFERRING DIAG: low back pain  Rationale for Evaluation and Treatment: Rehabilitation  THERAPY DIAG:  Other low  back pain  Decreased ROM of lumbar spine  Impaired functional mobility, balance, gait, and endurance  Muscle weakness (generalized)  ONSET DATE: About 6 months  SUBJECTIVE:                                                                                                                                                                                           SUBJECTIVE STATEMENT: Pt reports 0/10 pain upon presentation. Pt states she was a little sore following last session.  Patient reports low back pain that is Not a constant pain. Whenever she stands after sitting for long periods it hurts initially but after walking a little it goes away. Describes pain as a catch. Sometimes travel to R hip, and that also feels like a catch. Feels pain when bending and Lifting from ground.   PERTINENT HISTORY:  GERD  PAIN:  Are you having pain? No  PRECAUTIONS: None  RED FLAGS: Bowel or bladder incontinence: No   WEIGHT BEARING RESTRICTIONS: No  FALLS:  Has patient fallen in last 6 months? 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   PATIENT SURVEYS:  Modified Oswestry Low Back Pain Disability Questionnaire: 11 / 50 = 22.0 %  COGNITION: Overall cognitive status: Within functional limits for tasks assessed     SENSATION: WFL  MUSCLE LENGTH: Hamstrings: Right ~130 deg; Left ~130 deg   POSTURE: rounded shoulders and forward head  PALPATION: TTP t/o lumbar all vertebrae and paraspinals. Muscle spasms occurring with grade 1-2 mobs. Inc pain with pressure on R hip when in side-lying.   LUMBAR ROM:   AROM eval  Flexion WFL  Extension 50% avail, pain on L side Low back   Right lateral flexion 25% avail, inc difficulty  Left lateral  flexion 50% avail  Right rotation WFL  Left rotation WFL   (Blank rows = not tested)  LOWER EXTREMITY ROM:     Active  Right eval Left eval  Hip flexion    Hip extension    Hip abduction    Hip adduction    Hip internal rotation    Hip external rotation    Knee flexion    Knee extension    Ankle dorsiflexion    Ankle plantarflexion    Ankle inversion    Ankle eversion     (Blank rows = not tested)  LOWER EXTREMITY MMT:    MMT Right eval Left eval  Hip flexion    Hip extension 3+ 3+  Hip abduction 4- 4-  Hip adduction    Hip internal rotation    Hip external rotation    Knee flexion    Knee extension    Ankle dorsiflexion 5 5  Ankle plantarflexion    Ankle inversion    Ankle eversion     (Blank rows = not tested)  LUMBAR SPECIAL TESTS:    FUNCTIONAL TESTS:  30 seconds chair stand test 2 minute walk test:   30 sec chair rise: 7.5 LB feels a little sore after, pt with one stumble in posterior direction  : 168'  GAIT: Distance walked: 21' Assistive device utilized: None Level of assistance: Complete Independence Comments: Dec velocity, dec step lengths bilat, no LOB, dec hip ext bilat  TREATMENT DATE:  01/29/2024  Therapeutic Exercise: -Supine bridges 2 sets of 10 reps, 3 second holds, symptomatic, pt cued for max hip extension -Standing 3 way hip 1 sets 5 reps, bilaterally, pt cued for upright trunk and maintaining of neutral spine -Lateral stepping 2 laps 20 steps per lap, with RTB around ankles, pt cued for upright posture -Walking marches, 2 lap of 20 feet  Neuromuscular Re-education: -LTR 2 set of 10 reps bilaterally, pt cued to remain in pain free ROM  Therapeutic Activity: -Sit  to stands, 2 sets of 8 reps, pt cued for core activation -Step ups, 2 sets of 6 reps -Lateral step up and overs 2 sets of 3 reps, pt cued for increased step length for foot clearance   01/20/24: NuStep, Seat 4, lvl 1, 5' Seated forward flexion rollout with  physioball, 5" holds To R and L 10x ea STS w/ TA contraction, 10x  YTB around knees, 10x Core set, pushing physioball into mat with BUE forward, 5" holdsx10  Maintained core set w/ one UE, standing sideways, march with LEs Supine TA Contraction with march, 2x10 Bridge w/ TA contraction, 10x2 TA contraction with heel taps, 10x2  01/15/24: Supine: LTR, 2x10 TA Contraction, 5"x10 Bridge, 10x2 Hip abd / TA contract, YTB,  Hip add w/ TA contract, Yellow ball STS, 2x10  2nd set with YTB around knee to limit valgus moment  Standing hip extension in // bars: 12x ea LE Side Steps in // bars: 2x down and back  Forward marching in // bars: 2x down and back  Forward step ups, 8 inch step, 10x2 ea LE leading, BUE support-->oneUE support     PATIENT EDUCATION:  Education details: PT evaluation, objective findings, POC, Importance of HEP, Precautions, Clinic policies  Person educated: Patient Education method: Explanation and Demonstration Education comprehension: verbalized understanding and returned demonstration  HOME EXERCISE PROGRAM: Access Code: [email or web address] URL: [email or web address] Date: 12/23/2023 Prepared by: Virgia Griffins Powell-Butler  Exercises - Supine Lower Trunk Rotation  - 1 x daily - 7 x weekly - 3 sets - 10 reps - Hooklying Hamstring Stretch with Strap  - 1 x daily - 7 x weekly - 3 sets - 10 reps - Hooklying Single Knee to Chest Stretch  - 1 x daily - 7 x weekly - 3 sets - 10 reps  Access Code: [email or web address] URL: [email or web address] Date: 01/13/2024 Prepared by: Virgia Griffins Powell-Butler  Exercises - Supine Piriformis Stretch with Foot on Ground  - 2 x daily - 7 x weekly - 2 sets - 10 reps - 30 hold - Supine Transversus Abdominis Bracing - Hands on Stomach  - 2 x daily - 7 x weekly - 2 sets - 10 reps - 10 hold - Seated Hamstring Stretch  - 2 x daily - 7 x weekly - 2 sets - 30 hold - Standing Hip Extension with Counter Support  - 1 x daily - 7 x weekly -  3 sets - 10 reps  Exercises - Isometric Dead Bug  - 2 x daily - 7 x weekly - 2 sets - 10 reps - Supine March  - 2 x daily - 7 x weekly - 2 sets - 10 reps - Supine Bridge  - 2 x daily - 7 x weekly - 2 sets - 10 reps  ASSESSMENT:  CLINICAL IMPRESSION: Patient continues to demonstrate imporovements with decreased back pain, improved LE strength, and improved gait quality. Patient also demonstrates good performance with walking marches but does require SBA for safety due to balance impairments. Patient able to initiate dynamic balance and core activation exercises today with lateral stepping, walking marches, and lateral step overs, good performance with verbal cueing. Patient would continue to benefit from skilled physical therapy for decreased low back pain, increased endurance with ambulation, increased LE strength, and improved core strength for improved quality of life, improved community ambulation and continued progress towards therapy goals.    Patient is a 89 y.o. female who was seen today for physical therapy evaluation and treatment for low back pain.  Patient demonstrates decreased ROM in lumbar spine, tight hamstring musculature, altered gait mechanics, and decreased LE strength which all may be contributing to her increased pain, specifically with transitional movements and limiting her performance of functional tasks such as bending, lifting, ambulation, and prolonged sitting/standing. Patient will benefit from skilled physical therapy in order to address the above to improve overall function and QOL.   OBJECTIVE IMPAIRMENTS: Abnormal gait, decreased activity tolerance, decreased balance, decreased endurance, decreased mobility, decreased strength, impaired flexibility, and pain.   ACTIVITY LIMITATIONS: bending, sitting, standing, squatting, stairs, and transfers  PARTICIPATION LIMITATIONS: cleaning, laundry, occupation, and yard work  [facility] POTENTIAL: Fair    CLINICAL DECISION  MAKING: Stable/uncomplicated  EVALUATION COMPLEXITY: Low   GOALS: Goals reviewed with patient? Yes  SHORT TERM GOALS: Target date: 01/06/24  Patient will be independent with performance of HEP to demonstrate adequate self management of symptoms.  Baseline:  Goal status: INITIAL  2.   Patient will report at least a 25% improvement with function or pain overall since beginning PT. Baseline:  Goal status: INITIAL   LONG TERM GOALS: Target date: 01/20/24  Patient will improve Oswestry score by 6 points in order to improve self-perceived disability and overall function.  Baseline: See above Goal status: INITIAL  2.  Patient will improve 30 sec chair rise score to at least 12 STS in order to display improve LE strength and power for functional tasks  .  Baseline: See above Goal status: INITIAL  3.  Patient will improve test by at least 79ft  in order to demonstrate improved LE and overall endurance. Baseline: See above Goal status: INITIAL  4.  Patient will report overall 50% improvement since beginning PT. Baseline:  Goal status: INITIAL   PLAN:  PT FREQUENCY: 2x/week  PT DURATION: 4 weeks  PLANNED INTERVENTIONS: 97164- PT Re-evaluation, 97110-Therapeutic exercises, 97530- Therapeutic activity, [id number]- Neuromuscular re-education, 97535- Self Care, 16109- Manual therapy, [phone]- Gait training, Patient/Family education, Joint mobilization, Spinal mobilization, and Moist heat.  PLAN FOR NEXT SESSION: Progress low back mobility, balance, LE/hip strengthening, Core strengthening, postural exer. Re-evaluate next session.   Armond Bertin, PT, DPT [facility] Office: [phone] 2:29 PM, 01/29/24

## 2024-02-03 ENCOUNTER — Encounter (HOSPITAL_COMMUNITY)

## 2024-02-03 ENCOUNTER — Telehealth (HOSPITAL_COMMUNITY): Payer: Self-pay

## 2024-02-03 NOTE — Telephone Encounter (Signed)
 Called patient concerning missed appointment on this date. No answer. Left message for patient on voicemail with no show/cancellation policy and reminder of next scheduled appointment on Friday 02/05/24.   5:52 PM, 02/03/24 Marysue Sola, PT, DPT [facility] [place]

## 2024-02-05 ENCOUNTER — Ambulatory Visit (HOSPITAL_COMMUNITY)

## 2024-02-05 DIAGNOSIS — M5386 Other specified dorsopathies, lumbar region: Secondary | ICD-10-CM

## 2024-02-05 DIAGNOSIS — M6281 Muscle weakness (generalized): Secondary | ICD-10-CM

## 2024-02-05 DIAGNOSIS — Z7409 Other reduced mobility: Secondary | ICD-10-CM | POA: Diagnosis not present

## 2024-02-05 DIAGNOSIS — M5459 Other low back pain: Secondary | ICD-10-CM | POA: Diagnosis not present

## 2024-02-05 NOTE — Therapy (Signed)
 OUTPATIENT PHYSICAL THERAPY THORACOLUMBAR EVALUATION   Patient Name: KALLEIGH HARBOR MRN: [id number] DOB:1931-09-05, 89 y.o., female Today's Date: 02/05/2024   PHYSICAL THERAPY DISCHARGE SUMMARY  Visits from Start of Care: 5  Current functional level related to goals / functional outcomes: See below   Remaining deficits: See below   Education / Equipment: See below   Patient agrees to discharge. Patient goals were met. Patient is being discharged due to being pleased with the current functional level.   END OF SESSION:  PT End of Session - 02/05/24 1351     Visit Number 6    Number of Visits 9    Date for PT Re-Evaluation 02/03/24    Authorization Type Medicare part A and B    Authorization Time Period No auth, No limit    Progress Note Due on Visit 10    PT Start Time 1351    PT Stop Time 1417    PT Time Calculation (min) 26 min    Activity Tolerance Patient tolerated treatment well    Behavior During Therapy WFL for tasks assessed/performed              Past Medical History:  Diagnosis Date   CKD (chronic kidney disease)    GERD (gastroesophageal reflux disease)    Heart murmur    HLD (hyperlipidemia)    HTN (hypertension)    Past Surgical History:  Procedure Laterality Date   ABDOMINAL HYSTERECTOMY     BIOPSY  06/04/2022   Procedure: BIOPSY;  Surgeon: Suzette Espy, MD;  [facility];  Service: Endoscopy;;   COLONOSCOPY     years ago with polyps per patient   ESOPHAGOGASTRODUODENOSCOPY (EGD) WITH PROPOFOL  N/A 06/04/2022   Procedure: ESOPHAGOGASTRODUODENOSCOPY (EGD) WITH PROPOFOL ;  Surgeon: Suzette Espy, MD;  [facility];  Service: Endoscopy;  Laterality: N/A;  11:00 am   Patient Active Problem List   Diagnosis Date Noted   Oral candidiasis 11/14/2022   Gas bloat syndrome 11/14/2022   LUQ pain 07/16/2022   Constipation 04/28/2022   Epigastric burning sensation 04/28/2022   SYSTOLIC MURMUR 09/26/2008   Osteoporosis  12/22/2006   HYPERLIPIDEMIA 08/19/2006   Essential hypertension 08/19/2006   Gastroesophageal reflux disease 08/19/2006   IBS 08/19/2006   History of colonic polyps 08/19/2006    PCP: Omie Bickers, MD  REFERRING PROVIDER: Omie Bickers, MD  REFERRING DIAG: low back pain  Rationale for Evaluation and Treatment: Rehabilitation  THERAPY DIAG:  Other low back pain  Decreased ROM of lumbar spine  Impaired functional mobility, balance, gait, and endurance  Muscle weakness (generalized)  ONSET DATE: About 6 months  SUBJECTIVE:  SUBJECTIVE STATEMENT: Pt reports 1/10 pain if any. Reports improvement with pain during STS, it just feels like a mild soreness. Reports she had some pain for a few days after core exercises last time but after that she felt great .   Patient reports low back pain that is Not a constant pain. Whenever she stands after sitting for long periods it hurts initially but after walking a little it goes away. Describes pain as a catch. Sometimes travel to R hip, and that also feels like a catch. Feels pain when bending and Lifting from ground.   PERTINENT HISTORY:  GERD  PAIN:  Are you having pain? No  PRECAUTIONS: None  RED FLAGS: Bowel or bladder incontinence: No   WEIGHT BEARING RESTRICTIONS: No  FALLS:  Has patient fallen in last 6 months? 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   PATIENT SURVEYS:  Modified Oswestry Low Back Pain Disability Questionnaire: 11 / 50 = 22.0 %  02/05/24:Modified Oswestry Low Back Pain  Disability Questionnaire: 5 / 50 = 10.0 %  COGNITION: Overall cognitive status: Within functional limits for tasks assessed     SENSATION: WFL  MUSCLE LENGTH: Hamstrings: Right ~130 deg; Left ~130 deg   POSTURE: rounded shoulders and forward head  PALPATION: TTP t/o lumbar all vertebrae and paraspinals. Muscle spasms occurring with grade 1-2 mobs. Inc pain with pressure on R hip when in side-lying.   LUMBAR ROM:   AROM eval 02/05/24  Flexion WFL WFL, * At mid range of motion but none before or after  Extension 50% avail, pain on L side Low back  50% avail, * on R side at end of range  Right lateral flexion 25% avail, inc difficulty To knee joint  Left lateral flexion 50% avail To knee joint  Right rotation [facility] WFL  Left rotation [facility] WFL   (Blank rows = not tested)   *=painful  LOWER EXTREMITY ROM:     Active  Right eval Left eval  Hip flexion    Hip extension    Hip abduction    Hip adduction    Hip internal rotation    Hip external rotation    Knee flexion    Knee extension    Ankle dorsiflexion    Ankle plantarflexion    Ankle inversion    Ankle eversion     (Blank rows = not tested)  LOWER EXTREMITY MMT:    MMT Right eval Left eval Right 02/05/24: Left  02/05/24  Hip flexion      Hip extension 3+ 3+ 4- 4-  Hip abduction 4- 4- 4 4  Hip adduction      Hip internal rotation      Hip external rotation      Knee flexion      Knee extension      Ankle dorsiflexion 5 5    Ankle plantarflexion      Ankle inversion      Ankle eversion       (Blank rows = not tested)  LUMBAR SPECIAL TESTS:    FUNCTIONAL TESTS:  30 seconds chair stand test 2 minute walk test:   30 sec chair rise: 7.5 LB feels a little sore after, pt with one stumble in posterior  direction  : 168'  02/05/24: 30 sec chair rise: 8 STS, felt a very little tingle on R side, hip joint  : 238'  GAIT: Distance walked: 24' Assistive device utilized: None Level of assistance:  Complete Independence Comments: Dec velocity, dec step lengths bilat, no LOB, dec hip ext bilat   TREATMENT DATE:  02/05/24: Progress Note: Mod Oswestry ROM MMT 30 sec STS Review of goals   01/29/2024  Therapeutic Exercise: -Supine bridges 2 sets of 10 reps, 3 second holds, symptomatic, pt cued for max hip extension -Standing 3 way hip 1 sets 5 reps, bilaterally, pt cued for upright trunk and maintaining of neutral spine -Lateral stepping 2 laps 20 steps per lap, with RTB around ankles, pt cued for upright posture -Walking marches, 2 lap of 20 feet  Neuromuscular Re-education: -LTR 2 set of 10 reps bilaterally, pt cued to remain in pain free ROM  Therapeutic Activity: -Sit to stands, 2 sets of 8 reps, pt cued for core activation -Step ups, 2 sets of 6 reps -Lateral step up and overs 2 sets of 3 reps, pt cued for increased step length for foot clearance   01/20/24: NuStep, Seat 4, lvl 1, 5' Seated forward flexion rollout with physioball, 5" holds To R and L 10x ea STS w/ TA contraction, 10x  YTB around knees, 10x Core set, pushing physioball into mat with BUE forward, 5" holdsx10  Maintained core set w/ one UE, standing sideways, march with LEs Supine TA Contraction with march, 2x10 Bridge w/ TA contraction, 10x2 TA contraction with heel taps, 10x2   PATIENT EDUCATION:  Education details: PT evaluation, objective findings, POC, Importance of HEP, Precautions, Clinic policies  Person educated: Patient Education method: Explanation and Demonstration Education comprehension: verbalized understanding and returned demonstration  HOME EXERCISE PROGRAM: Access Code: [email or web address] URL: [email or web address] Date: 12/23/2023 Prepared by: Virgia Griffins Powell-Butler  Exercises - Supine Lower Trunk Rotation  - 1 x daily - 7 x weekly - 3 sets - 10 reps - Hooklying Hamstring Stretch with Strap  - 1 x daily - 7 x weekly - 3 sets - 10 reps - Hooklying Single Knee to Chest  Stretch  - 1 x daily - 7 x weekly - 3 sets - 10 reps  Access Code: [email or web address] URL: [email or web address] Date: 01/13/2024 Prepared by: Virgia Griffins Powell-Butler  Exercises - Supine Piriformis Stretch with Foot on Ground  - 2 x daily - 7 x weekly - 2 sets - 10 reps - 30 hold - Supine Transversus Abdominis Bracing - Hands on Stomach  - 2 x daily - 7 x weekly - 2 sets - 10 reps - 10 hold - Seated Hamstring Stretch  - 2 x daily - 7 x weekly - 2 sets - 30 hold - Standing Hip Extension with Counter Support  - 1 x daily - 7 x weekly - 3 sets - 10 reps  Exercises - Isometric Dead Bug  - 2 x daily - 7 x weekly - 2 sets - 10 reps - Supine March  - 2 x daily - 7 x weekly - 2 sets - 10 reps - Supine Bridge  - 2 x daily - 7 x weekly - 2 sets - 10 reps    ASSESSMENT:  CLINICAL IMPRESSION: Progress Note administered on this date. Patient demonstrates improvements with self perceived disability, lumbar ROM, LE strength, 30 second sit to stand test, and with 2 minute walk test. Patient has met or partially met all objective goals and reports her previous  pain as just a sore spot on R that sometimes causes her discomfort with bending forward but she reports transitional motions such as sit to stand are much better than when she first began therapy, which was her biggest issue. Patient reports she is still adequately challenged with her HEP. Education on continued compliance and daily walks or possible [employer] membership to stay active on her off days. Patient is discharged to HEP on this date.     Patient is a 89 y.o. female who was seen today for physical therapy evaluation and treatment for low back pain. Patient demonstrates decreased ROM in lumbar spine, tight hamstring musculature, altered gait mechanics, and decreased LE strength which all may be contributing to her increased pain, specifically with transitional movements and limiting her performance of functional tasks such as bending, lifting,  ambulation, and prolonged sitting/standing. Patient will benefit from skilled physical therapy in order to address the above to improve overall function and QOL.   OBJECTIVE IMPAIRMENTS: Abnormal gait, decreased activity tolerance, decreased balance, decreased endurance, decreased mobility, decreased strength, impaired flexibility, and pain.   ACTIVITY LIMITATIONS: bending, sitting, standing, squatting, stairs, and transfers  PARTICIPATION LIMITATIONS: cleaning, laundry, occupation, and yard work  [facility] POTENTIAL: Fair    CLINICAL DECISION MAKING: Stable/uncomplicated  EVALUATION COMPLEXITY: Low   GOALS: Goals reviewed with patient? Yes  SHORT TERM GOALS: Target date: 01/06/24  Patient will be independent with performance of HEP to demonstrate adequate self management of symptoms.  Baseline:  Goal status: MET  2.   Patient will report at least a 25% improvement with function or pain overall since beginning PT. Baseline: Pt reports 75% improvement, just mild soreness at times Goal status: MET   LONG TERM GOALS: Target date: 01/20/24  Patient will improve Oswestry score by 6 points in order to improve self-perceived disability and overall function.  Baseline: See above Goal status: MET  2.  Patient will improve 30 sec chair rise score to at least 12 STS in order to display improve LE strength and power for functional tasks  .  Baseline: See above Goal status: PARTIALLY MET  3.  Patient will improve test by at least 86ft  in order to demonstrate improved LE and overall endurance. Baseline: See above Goal status: MET  4.  Patient will report overall 50% improvement since beginning PT. Baseline:  Goal status: MET   PLAN:  PT FREQUENCY: 2x/week  PT DURATION: 4 weeks  PLANNED INTERVENTIONS: 97164- PT Re-evaluation, 97110-Therapeutic exercises, 97530- Therapeutic activity, [id number]- Neuromuscular re-education, 97535- Self Care, 78469- Manual therapy, [phone]- Gait training,  Patient/Family education, Joint mobilization, Spinal mobilization, and Moist heat.  PLAN FOR NEXT SESSION: Discharged.   2:30 PM, 02/05/24 Marysue Sola, PT, DPT [facility] [place]

## 2024-04-06 NOTE — Progress Notes (Unsigned)
 GI Office Note    Referring Provider: Shona Norleen PEDLAR, MD Primary Care Physician:  Shona Norleen PEDLAR, MD Primary Gastroenterologist: Lamar HERO.Rourk, MD  Date:  04/07/2024  ID:  Megan Mcguire, DOB 12-15-1930, MRN [id number]  Chief Complaint   Chief Complaint  Patient presents with   Follow-up    Doing well, no gi issues   History of Present Illness  Megan Mcguire is a 89 y.o. female with a history of chronic GERD and epigastric pain/LUQ pain with slight improvement on PPI twice daily, chronic constipation, and chronically on Librax twice daily presenting today for follow up constipation with no complaints.   EGD September 2023: -Distal esophageal nodule uncertain significance status post biopsy.  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  -Small hiatal hernia.  -Patchy gastric erythema status post biopsy; mild nonspecific reactive gastropathy, no H. pylori -normal duodenal bulb and second portion of the duodenum aside from single prominent lymphangitic appearing lesion in the second portion of the duodenum, biopsy c/w duodenal lymphangiectasia   OV 06/01/2023.  Patient reported having ongoing epigastric and left upper quadrant burning since her last office visit.  Denies any actual pain but states it is an annoyance.  Improved by eating which she admitted to was the same as since her prior EGD.  Belching with a sour taste and some early satiety but denied any actual nausea or vomiting.  Taking her pantoprazole 40 mg twice a day.  Taking MiraLAX daily but not having bowel movements daily and having incomplete emptying.  Mild left lower quadrant pain that improves with bowel movement.  Taking digestive advantage which helps some, taking for about a year.  Advised to stop pantoprazole and start Nexium  40 mg twice a day, reinforced GERD diet.  Advised to start Linzess  daily and provided samples.Patient requested prescription  for Linzess  72 mcg daily earlier November 2025.   Last office visit 07/27/23. Unable to afford Linzess  so she went back to miralax once daily. BM every other day. Emptying more effective and no abdominal pain. Prune juice helpful at times. Gassiness had been better since being on Nexium . No big changes to appetite. Advised that she could increase her miralax to twice daily. Continue PPI BID, could consider trial of amitiza, ibsrella, trulance. Follow up in 6 months.   Today:  She never has remembered going daily. Currently goes every other day. Does have some days where she is gassy. Takes her miralax every other day but sometimes twice a day if they dont move on that second day. Continue to take her librax once daily. Sometimes has been feeling bloated or full in the upper abdomen.   Has continued on the nexium  Bid and librax once daily. Does not need tums and no breakthrough symptoms at this time. No melena or brpbr.   Sees Dr. Shona next month about her leg spots.   Wt Readings from Last 5 Encounters:  04/07/24 129 lb 3.2 oz (58.6 kg)  07/27/23 131 lb (59.4 kg)  06/01/23 129 lb 3.2 oz (58.6 kg)  03/02/23 130 lb 9.6 oz (59.2 kg)  11/14/22 128 lb 12.8 oz (58.4 kg)    Current Outpatient Medications  Medication Sig Dispense Refill   amLODipine-olmesartan (AZOR) 10-40 MG tablet Take 1 tablet by mouth daily.     aspirin EC 81 MG tablet Take 81 mg by mouth daily.     atorvastatin  (LIPITOR) 10 MG tablet Take 1 tablet (10 mg total)  by mouth daily. 30 tablet 0   cholecalciferol (VITAMIN D3) 25 MCG (1000 UNIT) tablet Take 1,000 Units by mouth daily.     clidinium-chlordiazePOXIDE  (LIBRAX) 5-2.5 MG capsule Take 1 capsule by mouth in the morning and at bedtime. 180 capsule 3   esomeprazole  (NEXIUM ) 40 MG capsule Take 1 capsule (40 mg total) by mouth 2 (two) times daily before a meal. 180 capsule 3   metoprolol succinate (TOPROL-XL) 50 MG 24 hr tablet Take 50 mg by mouth daily.     polyethylene  glycol (MIRALAX / GLYCOLAX) 17 g packet Take 17 g by mouth daily.     No current facility-administered medications for this visit.    Past Medical History:  Diagnosis Date   CKD (chronic kidney disease)    GERD (gastroesophageal reflux disease)    Heart murmur    HLD (hyperlipidemia)    HTN (hypertension)     Past Surgical History:  Procedure Laterality Date   ABDOMINAL HYSTERECTOMY     BIOPSY  06/04/2022   Procedure: BIOPSY;  Surgeon: Shaaron Lamar HERO, MD;  [facility];  Service: Endoscopy;;   COLONOSCOPY     years ago with polyps per patient   ESOPHAGOGASTRODUODENOSCOPY (EGD) WITH PROPOFOL  N/A 06/04/2022   Procedure: ESOPHAGOGASTRODUODENOSCOPY (EGD) WITH PROPOFOL ;  Surgeon: Shaaron Lamar HERO, MD;  [facility];  Service: Endoscopy;  Laterality: N/A;  11:00 am    Family History  Problem Relation Age of Onset   Hypertension Mother    Heart failure Father     Allergies as of 04/07/2024   (No Known Allergies)    Social History   Socioeconomic History   Marital status: Married    Spouse name: Not on file   Number of children: 6   Years of education: Not on file   Highest education level: Not on file  Occupational History   Not on file  Tobacco Use   Smoking status: Never    Passive exposure: Never   Smokeless tobacco: Never  Vaping Use   Vaping status: Never Used  Substance and Sexual Activity   Alcohol use: Not Currently   Drug use: Never   Sexual activity: Not Currently  Other Topics Concern   Not on file  Social History Narrative   Not on file   Social Drivers of Health   Financial Resource Strain: Not on file  Food Insecurity: Not on file  Transportation Needs: Not on file  Physical Activity: Not on file  Stress: Not on file  Social Connections: Not on file     Review of Systems   Gen: Denies fever, chills, anorexia. Denies fatigue, weakness, weight loss.  CV: Denies chest pain, palpitations, syncope, peripheral edema, and  claudication. Resp: Denies dyspnea at rest, cough, wheezing, coughing up blood, and pleurisy. GI: See HPI Derm: Denies rash, itching, dry skin Psych: Denies depression, anxiety, memory loss, confusion. No homicidal or suicidal ideation.  Heme: Denies bruising, bleeding, and enlarged lymph nodes.  Physical Exam   BP (!) 160/73 (BP Location: Right Arm, Patient Position: Sitting, Cuff Size: Normal)   Pulse (!) 54   Temp 98.3 F (36.8 C) (Oral)   Ht 5' 1 (1.549 m)   Wt 129 lb 3.2 oz (58.6 kg)   SpO2 96%   BMI 24.41 kg/m   General:   Alert and oriented. No distress noted. Pleasant and cooperative. Head:  Normocephalic and atraumatic. Eyes:  Conjuctiva clear without scleral icterus.  Abdomen:  +BS, soft, non-tender and  non-distended. No rebound or guarding. No HSM or masses noted. Rectal: deferred Msk:  Symmetrical without gross deformities. Normal posture. Muscle and fat loss evident.  Extremities:  Without edema. Concern for lightening of area to her shin. Not scaly or red.  Neurologic:  Alert and  oriented x4 Psych:  Alert and cooperative. Normal mood and affect.  Assessment  SHERICE IJAMES is a 89 y.o. female presenting today for follow-up of chronic GI issues.  Chronic constipation: Continues to have a bowel movement every other day, taking MiraLAX about every other day but sometimes on that day will take it twice a day if she does not feel a bowel movement has been effective.  Previously unable to afford Linzess  72 mcg although it has worked fairly well for her in the past.  No longer having significant gassiness, just occasional.   Chronic epigastric pain, GERD: Continues to have some intermittent upper abdominal gassiness/bloating feeling continues on her Librax once daily given reflux symptoms are well-controlled with Nexium  twice daily.  I encouraged her if she is occasionally feeling gassy in the upper abdomen that she can use an additional dose of Librax in the afternoon.  We  did discuss that if this becomes more persistent and regular that we can obtain abdominal x-ray.  She is okay with this plan.  PLAN   Miralax 17g every other day Nexium  40mg  BID Continue Librax 1-2 times daily as needed GERD diet Xray in future if bloated and feeling full or worsening upper abdominal pain.  Follow up 6 months    Charmaine Melia, MSN, FNP-BC, AGACNP-BC [facility]

## 2024-04-07 ENCOUNTER — Encounter: Payer: Self-pay | Admitting: Gastroenterology

## 2024-04-07 ENCOUNTER — Ambulatory Visit (INDEPENDENT_AMBULATORY_CARE_PROVIDER_SITE_OTHER): Admitting: Gastroenterology

## 2024-04-07 VITALS — BP 160/73 | HR 54 | Temp 98.3°F | Ht 61.0 in | Wt 129.2 lb

## 2024-04-07 DIAGNOSIS — K5909 Other constipation: Secondary | ICD-10-CM | POA: Diagnosis not present

## 2024-04-07 DIAGNOSIS — R1013 Epigastric pain: Secondary | ICD-10-CM

## 2024-04-07 DIAGNOSIS — K5904 Chronic idiopathic constipation: Secondary | ICD-10-CM

## 2024-04-07 DIAGNOSIS — K219 Gastro-esophageal reflux disease without esophagitis: Secondary | ICD-10-CM | POA: Diagnosis not present

## 2024-04-07 NOTE — Patient Instructions (Addendum)
 Continue MiraLAX 1 capful every other day or twice daily if needed.  Continue Nexium  40 mg twice daily.  Continue your Librax 1-2 times a day as needed  Follow a GERD diet:  Avoid fried, fatty, greasy, spicy, citrus foods. Avoid caffeine and carbonated beverages. Avoid chocolate. Try eating 4-6 small meals a day rather than 3 large meals. Do not eat within 3 hours of laying down. Prop head of bed up on wood or bricks to create a 6 inch incline.   If you have worsening pain in your upper abdomen you may take your Librax and additional time during the day and if that does not work and the pain continues please let me know and I will be happy to get an abdominal x-ray for you.  Follow-up in 6 months, sooner if needed.   As we discussed if  you are having itching to your legs and you may apply over-the-counter hydrocortisone cream until your follow-up with Dr. Shona.  It was a pleasure to see you today. I want to create trusting relationships with patients. If you receive a survey regarding your visit,  I greatly appreciate you taking time to fill this out on paper or through your MyChart. I value your feedback.  Charmaine Melia, MSN, FNP-BC, AGACNP-BC [facility]

## 2024-04-22 DIAGNOSIS — E559 Vitamin D deficiency, unspecified: Secondary | ICD-10-CM | POA: Diagnosis not present

## 2024-04-22 DIAGNOSIS — E782 Mixed hyperlipidemia: Secondary | ICD-10-CM | POA: Diagnosis not present

## 2024-04-22 DIAGNOSIS — R7301 Impaired fasting glucose: Secondary | ICD-10-CM | POA: Diagnosis not present

## 2024-04-27 DIAGNOSIS — K5900 Constipation, unspecified: Secondary | ICD-10-CM | POA: Diagnosis not present

## 2024-04-27 DIAGNOSIS — K580 Irritable bowel syndrome with diarrhea: Secondary | ICD-10-CM | POA: Diagnosis not present

## 2024-04-27 DIAGNOSIS — I10 Essential (primary) hypertension: Secondary | ICD-10-CM | POA: Diagnosis not present

## 2024-04-27 DIAGNOSIS — I129 Hypertensive chronic kidney disease with stage 1 through stage 4 chronic kidney disease, or unspecified chronic kidney disease: Secondary | ICD-10-CM | POA: Diagnosis not present

## 2024-04-27 DIAGNOSIS — E559 Vitamin D deficiency, unspecified: Secondary | ICD-10-CM | POA: Diagnosis not present

## 2024-04-27 DIAGNOSIS — E870 Hyperosmolality and hypernatremia: Secondary | ICD-10-CM | POA: Diagnosis not present

## 2024-04-27 DIAGNOSIS — R011 Cardiac murmur, unspecified: Secondary | ICD-10-CM | POA: Diagnosis not present

## 2024-04-27 DIAGNOSIS — R7301 Impaired fasting glucose: Secondary | ICD-10-CM | POA: Diagnosis not present

## 2024-04-27 DIAGNOSIS — R809 Proteinuria, unspecified: Secondary | ICD-10-CM | POA: Diagnosis not present

## 2024-04-27 DIAGNOSIS — E782 Mixed hyperlipidemia: Secondary | ICD-10-CM | POA: Diagnosis not present

## 2024-04-27 DIAGNOSIS — K219 Gastro-esophageal reflux disease without esophagitis: Secondary | ICD-10-CM | POA: Diagnosis not present

## 2024-04-27 DIAGNOSIS — N1831 Chronic kidney disease, stage 3a: Secondary | ICD-10-CM | POA: Diagnosis not present

## 2024-05-26 DIAGNOSIS — Z23 Encounter for immunization: Secondary | ICD-10-CM | POA: Diagnosis not present

## 2024-08-16 ENCOUNTER — Encounter: Payer: Self-pay | Admitting: Internal Medicine

## 2024-10-20 ENCOUNTER — Ambulatory Visit: Admitting: Gastroenterology

## 2024-10-20 ENCOUNTER — Encounter: Payer: Self-pay | Admitting: Gastroenterology

## 2024-10-20 VITALS — BP 138/76 | HR 56 | Temp 97.8°F | Ht 61.0 in | Wt 124.0 lb

## 2024-10-20 DIAGNOSIS — R634 Abnormal weight loss: Secondary | ICD-10-CM

## 2024-10-20 DIAGNOSIS — K5904 Chronic idiopathic constipation: Secondary | ICD-10-CM

## 2024-10-20 DIAGNOSIS — K5909 Other constipation: Secondary | ICD-10-CM | POA: Diagnosis not present

## 2024-10-20 DIAGNOSIS — K219 Gastro-esophageal reflux disease without esophagitis: Secondary | ICD-10-CM | POA: Diagnosis not present

## 2024-10-20 DIAGNOSIS — R1013 Epigastric pain: Secondary | ICD-10-CM

## 2024-10-20 DIAGNOSIS — R140 Abdominal distension (gaseous): Secondary | ICD-10-CM

## 2024-10-20 NOTE — Patient Instructions (Addendum)
 Continue nexium  40 mg twice daily.   Continue MiraLAX as needed.  If you are having any issues at all with the stools being a little bit more firm then you could either increase the frequency of her MiraLAX or you can try an over-the-counter stool softener like Colace.   If symptoms worsen or you become extra gassy please let me know.  Follow-up in 6 months.  It was a pleasure to see you today. I want to create trusting relationships with patients. If you receive a survey regarding your visit,  I greatly appreciate you taking time to fill this out on paper or through your MyChart. I value your feedback.  Charmaine Melia, MSN, FNP-BC, AGACNP-BC [facility]

## 2024-10-20 NOTE — Progress Notes (Signed)
 "  GI Office Note    Referring Provider: Shona Norleen PEDLAR, MD Primary Care Physician:  Shona Norleen PEDLAR, MD Primary Gastroenterologist: Lamar HERO.Rourk, MD  Date:  10/20/2024  ID:  Megan Mcguire, DOB 25-Feb-1931, MRN [id number]   Chief Complaint   Chief Complaint  Patient presents with   Follow-up    Follow up. Gassy sometimes    History of Present Illness  Megan Mcguire is a 89 y.o. female with a history of chronic GERD and upper abdominal pain, chronic constipation, and chronic abdominal pain on Librax presenting today for follow-up on GERD and constipation with complaint of occasional gassiness.  EGD September 2023: -Distal esophageal nodule uncertain significance status post biopsy.  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  -Small hiatal hernia.  -Patchy gastric erythema status post biopsy; mild nonspecific reactive gastropathy, no H. pylori -normal duodenal bulb and second portion of the duodenum aside from single prominent lymphangitic appearing lesion in the second portion of the duodenum, biopsy c/w duodenal lymphangiectasia   OV 06/01/2023.  Patient reported having ongoing epigastric and left upper quadrant burning since her last office visit.  Denies any actual pain but states it is an annoyance.  Improved by eating which she admitted to was the same as since her prior EGD.  Belching with a sour taste and some early satiety but denied any actual nausea or vomiting.  Taking her pantoprazole 40 mg twice a day.  Taking MiraLAX daily but not having bowel movements daily and having incomplete emptying.  Mild left lower quadrant pain that improves with bowel movement.  Taking digestive advantage which helps some, taking for about a year.  Advised to stop pantoprazole and start Nexium  40 mg twice a day, reinforced GERD diet.  Advised to start Linzess  daily and provided samples.Patient requested prescription for Linzess   72 mcg daily earlier November 2025.   OV 07/27/23. Unable to afford Linzess  so she went back to miralax once daily. BM every other day. Emptying more effective and no abdominal pain. Prune juice helpful at times. Gassiness had been better since being on Nexium . No big changes to appetite. Advised that she could increase her miralax to twice daily. Continue PPI BID, could consider trial of amitiza, ibsrella, trulance. Follow up in 6 months.   Last office visit July 2025.  Reports she never has a daily bowel movement, usually goes every other day and occasionally has gassiness.  On average taking MiraLAX every other day but occasionally may take twice a day if she does not have a bowel movement.  Has continue Librax once daily.  Compliant with Nexium  twice daily and denies any breakthrough symptoms.  Advised to continue MiraLAX every other day, Nexium  40 mg twice daily.  Continue Librax 1-2 times daily as needed, recommend holding for severe constipation.  Follow reflux diet labs and abdominal x-ray in the future if feeling well or worsening upper abdominal pain.   Today:  Discussed the use of AI scribe software for clinical note transcription with the patient, who gave verbal consent to proceed.  Bowel movements occur every other day. Miralax is used as needed for bloating, not daily. No use of stool softeners. Stools are firm but not hard, dry, or loose. Occasionally, a second bowel movement follows a difficult first movement within 10-15 minutes, after which she feels finished. No significant straining, abdominal pain, blood in stool, black stools, nausea, or vomiting.  Nexium  is taken before meals, resulting  in significant symptom improvement. Prior to Nexium , she experienced a burning abdominal sensation until after a bowel movement and had difficulty with bowel movements. Currently, no abdominal pain. Occasionally, an air pocket or gas sensation occurs until medication is taken, then resolves.  Appetite is good, and medication is taken at least 30 minutes before eating.  She has experienced a five-pound weight loss since the last visit. She reports eating fewer meals per day due to decreased work activity. She has recently resumed eating three meals a day.      Wt Readings from Last 6 Encounters:  10/20/24 124 lb (56.2 kg)  04/07/24 129 lb 3.2 oz (58.6 kg)  07/27/23 131 lb (59.4 kg)  06/01/23 129 lb 3.2 oz (58.6 kg)  03/02/23 130 lb 9.6 oz (59.2 kg)  11/14/22 128 lb 12.8 oz (58.4 kg)    Body mass index is 23.43 kg/m.  Current Outpatient Medications  Medication Sig Dispense Refill   amLODipine-olmesartan (AZOR) 10-40 MG tablet Take 1 tablet by mouth daily.     aspirin EC 81 MG tablet Take 81 mg by mouth daily.     atorvastatin  (LIPITOR) 10 MG tablet Take 1 tablet (10 mg total) by mouth daily. 30 tablet 0   cholecalciferol (VITAMIN D3) 25 MCG (1000 UNIT) tablet Take 1,000 Units by mouth daily.     clidinium-chlordiazePOXIDE  (LIBRAX) 5-2.5 MG capsule Take 1 capsule by mouth in the morning and at bedtime. 180 capsule 3   esomeprazole  (NEXIUM ) 40 MG capsule Take 1 capsule (40 mg total) by mouth 2 (two) times daily before a meal. 180 capsule 3   metoprolol succinate (TOPROL-XL) 50 MG 24 hr tablet Take 50 mg by mouth daily.     polyethylene glycol (MIRALAX / GLYCOLAX) 17 g packet Take 17 g by mouth daily.     No current facility-administered medications for this visit.    Past Medical History:  Diagnosis Date   CKD (chronic kidney disease)    GERD (gastroesophageal reflux disease)    Heart murmur    HLD (hyperlipidemia)    HTN (hypertension)     Past Surgical History:  Procedure Laterality Date   ABDOMINAL HYSTERECTOMY     BIOPSY  06/04/2022   Procedure: BIOPSY;  Surgeon: Shaaron Lamar HERO, MD;  Location: [facility];  Service: Endoscopy;;   COLONOSCOPY     years ago with polyps per patient   ESOPHAGOGASTRODUODENOSCOPY (EGD) WITH PROPOFOL  N/A 06/04/2022   Procedure:  ESOPHAGOGASTRODUODENOSCOPY (EGD) WITH PROPOFOL ;  Surgeon: Shaaron Lamar HERO, MD;  Location: [facility];  Service: Endoscopy;  Laterality: N/A;  11:00 am    Family History  Problem Relation Age of Onset   Hypertension Mother    Heart failure Father     Allergies as of 10/20/2024   (No Known Allergies)    Social History   Socioeconomic History   Marital status: Married    Spouse name: Not on file   Number of children: 6   Years of education: Not on file   Highest education level: Not on file  Occupational History   Not on file  Tobacco Use   Smoking status: Never    Passive exposure: Never   Smokeless tobacco: Never  Vaping Use   Vaping status: Never Used  Substance and Sexual Activity   Alcohol use: Not Currently   Drug use: Never   Sexual activity: Not Currently  Other Topics Concern   Not on file  Social History Narrative   Not on file  Social Drivers of Health   Tobacco Use: Low Risk (10/20/2024)   Patient History    Smoking Tobacco Use: Never    Smokeless Tobacco Use: Never    Passive Exposure: Never  Financial Resource Strain: Not on file  Food Insecurity: Not on file  Transportation Needs: Not on file  Physical Activity: Not on file  Stress: Not on file  Social Connections: Not on file  Depression (EYV7-0): Not on file  Alcohol Screen: Not on file  Housing: Not on file  Utilities: Not on file  Health Literacy: Not on file    Review of Systems   Gen: Denies fever, chills, anorexia. Denies fatigue, weakness, weight loss.  CV: Denies chest pain, palpitations, syncope, peripheral edema, and claudication. Resp: Denies dyspnea at rest, cough, wheezing, coughing up blood, and pleurisy. GI: See HPI Derm: Denies rash, itching, dry skin Psych: Denies depression, anxiety, memory loss, confusion. No homicidal or suicidal ideation.  Heme: Denies bruising, bleeding, and enlarged lymph nodes.  Physical Exam   BP 138/76 (BP Location: Right Arm, Patient  Position: Sitting, Cuff Size: Normal)   Pulse (!) 56   Temp 97.8 F (36.6 C) (Temporal)   Ht 5' 1 (1.549 m)   Wt 124 lb (56.2 kg)   BMI 23.43 kg/m   General:   Alert and oriented. No distress noted. Pleasant and cooperative.  Head:  Normocephalic and atraumatic. Eyes:  Conjuctiva clear without scleral icterus. Mouth:  Oral mucosa pink and moist.  No lesions. Face:  small about 1cm round firm mobile nodule noted to right cheek.  Abdomen:  +BS, soft, non-tender and non-distended. No rebound or guarding. No HSM or masses noted. Rectal: deferred Msk:  Symmetrical without gross deformities. Normal posture. Extremities:  Without edema. Neurologic:  Alert and  oriented x4 Psych:  Alert and cooperative. Normal mood and affect.  Assessment & Plan  Megan Mcguire is a 89 y.o. female presenting today for follow up.     Constipation Chronic constipation, currently well-controlled with as-needed polyethylene glycol and no alarming features. - Discussed option to initiate a daily stool softener if stools become dry or if she desires increased regularity. - Advised that as-needed polyethylene glycol is appropriate provided she does not experience significant straining or abdominal pain. - Recommended monitoring for new symptoms such as increased straining, abdominal pain, or changes in stool character.  Gastroesophageal reflux disease Chronic gastroesophageal reflux disease, well-controlled on esomeprazole  with resolution of previous symptoms and no current complications or alarm features. - Advised continuation of esomeprazole  as prescribed before meals. - Discussed use of simethicone or alpha-galactosidase for occasional gas symptoms as needed. - Confirmed no need for esomeprazole  refill currently; instructed to notify if running low.  Weight loss Five pound weight loss noted since last visit. States has good appetite but since working less has not consistently been eating 3 meals per day.   - Planned to monitor weight and appetite, with further evaluation if unintentional weight loss occurs.  Advised to follow up with PCP regarding nodule to right cheek.  Follow up   Follow up 6 months.     Charmaine Melia, MSN, FNP-BC, AGACNP-BC [facility] "
